# Patient Record
Sex: Female | Born: 1965 | Race: White | Hispanic: No | Marital: Married | Smoking: Never smoker
Health system: Southern US, Community
[De-identification: ages and names within clinical notes are randomized; demographics above are authoritative.]

## PROBLEM LIST (undated history)

## (undated) DIAGNOSIS — C4491 Basal cell carcinoma of skin, unspecified: Secondary | ICD-10-CM

## (undated) HISTORY — DX: Basal cell carcinoma of skin, unspecified: C44.91

## (undated) HISTORY — PX: OTHER SURGICAL HISTORY: SHX169

---

## 2004-03-09 ENCOUNTER — Other Ambulatory Visit: Admission: RE | Admit: 2004-03-09 | Discharge: 2004-03-09 | Payer: Self-pay | Admitting: Obstetrics and Gynecology

## 2005-05-09 ENCOUNTER — Other Ambulatory Visit: Admission: RE | Admit: 2005-05-09 | Discharge: 2005-05-09 | Payer: Self-pay | Admitting: Obstetrics and Gynecology

## 2013-09-23 ENCOUNTER — Other Ambulatory Visit (HOSPITAL_COMMUNITY): Payer: Self-pay | Admitting: Orthopedic Surgery

## 2013-09-23 DIAGNOSIS — M25551 Pain in right hip: Secondary | ICD-10-CM

## 2013-10-01 ENCOUNTER — Other Ambulatory Visit (HOSPITAL_COMMUNITY): Payer: Self-pay

## 2013-10-02 ENCOUNTER — Ambulatory Visit (HOSPITAL_COMMUNITY)
Admission: RE | Admit: 2013-10-02 | Discharge: 2013-10-02 | Disposition: A | Discharge status: Home / Self Care | Payer: 59 | Source: Ambulatory Visit | Attending: Orthopedic Surgery | Admitting: Orthopedic Surgery

## 2013-10-02 DIAGNOSIS — M25551 Pain in right hip: Secondary | ICD-10-CM

## 2013-10-02 DIAGNOSIS — N72 Inflammatory disease of cervix uteri: Secondary | ICD-10-CM | POA: Insufficient documentation

## 2013-10-02 DIAGNOSIS — M25559 Pain in unspecified hip: Secondary | ICD-10-CM | POA: Insufficient documentation

## 2013-10-02 MED ORDER — IOHEXOL 180 MG/ML  SOLN
20.0000 mL | Freq: Once | INTRAMUSCULAR | Status: AC | PRN
  Administered 2013-10-02: 12 mL via INTRA_ARTICULAR

## 2013-10-02 MED ORDER — GADOBENATE DIMEGLUMINE 529 MG/ML IV SOLN
5.0000 mL | Freq: Once | INTRAVENOUS | Status: AC | PRN
  Administered 2013-10-02: 0.1 mL via INTRAVENOUS

## 2013-10-21 ENCOUNTER — Other Ambulatory Visit: Payer: Self-pay | Admitting: Orthopedic Surgery

## 2013-10-21 DIAGNOSIS — M25551 Pain in right hip: Secondary | ICD-10-CM

## 2014-01-23 ENCOUNTER — Other Ambulatory Visit: Payer: Self-pay | Admitting: Obstetrics and Gynecology

## 2014-01-23 DIAGNOSIS — N6324 Unspecified lump in the left breast, lower inner quadrant: Secondary | ICD-10-CM

## 2014-02-05 ENCOUNTER — Ambulatory Visit
Admission: RE | Admit: 2014-02-05 | Discharge: 2014-02-05 | Disposition: A | Discharge status: Home / Self Care | Payer: 59 | Source: Ambulatory Visit | Attending: Obstetrics and Gynecology | Admitting: Obstetrics and Gynecology

## 2014-02-05 ENCOUNTER — Encounter (INDEPENDENT_AMBULATORY_CARE_PROVIDER_SITE_OTHER): Payer: Self-pay

## 2014-02-05 DIAGNOSIS — N6324 Unspecified lump in the left breast, lower inner quadrant: Secondary | ICD-10-CM

## 2015-08-25 ENCOUNTER — Encounter: Payer: Self-pay | Admitting: Sports Medicine

## 2015-08-25 ENCOUNTER — Ambulatory Visit (INDEPENDENT_AMBULATORY_CARE_PROVIDER_SITE_OTHER): Payer: 59 | Admitting: Sports Medicine

## 2015-08-25 VITALS — BP 112/79

## 2015-08-25 DIAGNOSIS — M25551 Pain in right hip: Secondary | ICD-10-CM | POA: Insufficient documentation

## 2015-08-25 DIAGNOSIS — M25552 Pain in left hip: Secondary | ICD-10-CM

## 2015-08-25 MED ORDER — AMITRIPTYLINE HCL 25 MG PO TABS: 25.0000 mg | ORAL_TABLET | Freq: Every day | ORAL | Status: DC

## 2015-08-25 NOTE — Assessment & Plan Note (Signed)
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

## 2015-08-25 NOTE — Progress Notes (Signed)
Subjective:    Patient ID: Joanne Booth, female    DOB: 1966-02-25, 49 y.o.   MRN: [id number]  HPI  49 y/o female with unremarkable PMHx presenting for evaluation of Bilateral Hip / Leg for the last 18 years.  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  Reports first having difficulty/pain 18 years ago following her 2nd SVD.  Pain described as aching sensation in the RIGHT > LEFT ischial tuberosity/hamstring origin.  She reports that this was not (and still is not) present all the time.  Most prominent with excessive hip flexion such as stairs, running.  Now, pain has evolved to also having a dull ache most of the time.  Worse with tight, dull ache.  Sharp pain along ITB and posterior thigh.  She has pain throughout the day and has a heaviness sensation in her legs and a weakness.  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  No longer exercising secondary to pain.  Activity that is most bothersome is hiking/iclines, with equal pain coming up and going down.  Previously seen by Dr. Wynelle Link has been following her for 8 years since moving to [place].  No response to prior intra-articular hip injection; physical therapy at his office.  Reports very poor sleep at night secondary to pain.  PMHx, PSHx, Medications, Allergies and Social history were reviewed and updated in EMR.     Review of Systems 10-point ROS was negative other than above. No hx of joint swelling Some stiffness in mornings Denies hypothyroid sxs No deep pelvic pain    Objective:   Physical Exam  Gen: No acute distress, resting comfortably seated BP 112/79 mmHg  Cardiopulmonary: Non-labored respirations; 2+ pulses in bilateral LE  - Non-tender to palpation along the bilateral SI joint, pubic symphysis, ischial tuberosity and greater trochanter  Bilateral HIP exam: - No abnormalities on inspection - Leg length symmetric and  appropriate when seated and supine - Good pelvis/hip mobility with rocking; Negative FABERE bilaterally Good alignment   - Mild TTP on bilateral SI joints - 4/5 strength in bilateral hip abduction and 5/5 in bilateral adduction --->>> more pain/weakness in stress position for TFL and gluteus medius; 5/5 for gluteus maximus    Trendelenburg bilaterally, LEFT > Right  - 5/5 strength in bilateral prone hip extension (knee extended) and knee flexion (30 degrees flexion) - 5/5 strength in bilateral hip flexion - 5/5 strength in IR and ER bilaterally  - Negative FABERE, NOBLE, OBER's bilaterally  - Negative log roll bilaterally - Negative joint loading/rotation  Imaging: MRI reviewed ---- Most recent 10/02/2013 was reviewed and read as negative MRI-arthogram of the RIGHT hip, negative MRI of LEFT hip. Prior MRI from 2008 not available for review / not located in [place]:   49 y/o female with long-standing history (18 years) of bilateral hip / posterior thigh pain for which she has had extensive workup with orthopedic surgery in the past including recent negative MRI of hips in Late 2014.  Exam today only notable for 4/5 hip abduction strength, especially at TFL and gluteus medius and resultant positive trendelenburg.  Further complicated by poor sleep.  #1. 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  Since that time, caught in biomechanical negative feedback loop with pain, leading to poor gluteus medius firing /  TFL firing.  Extensive workup for structural damage has been unremarkable as performed by referring orthopedic surgeon.  Plan: -- Hip exercises (lateral step ups, cross-over step ups, lateal leg lifts and standing hip rotation) 3 days/week -- Amytriptyline 25mg  qHS until follow-up -- Assess response to pain and sleep quality -- Follow-up in 4-6 weeks -- Consider  EMG or other advanced diagnostics if no improvement  ===

## 2015-09-30 ENCOUNTER — Ambulatory Visit: Payer: 59 | Admitting: Sports Medicine

## 2015-12-09 DIAGNOSIS — E559 Vitamin D deficiency, unspecified: Secondary | ICD-10-CM | POA: Diagnosis not present

## 2015-12-11 DIAGNOSIS — C44511 Basal cell carcinoma of skin of breast: Secondary | ICD-10-CM | POA: Diagnosis not present

## 2015-12-11 DIAGNOSIS — D225 Melanocytic nevi of trunk: Secondary | ICD-10-CM | POA: Diagnosis not present

## 2015-12-11 DIAGNOSIS — D485 Neoplasm of uncertain behavior of skin: Secondary | ICD-10-CM | POA: Diagnosis not present

## 2015-12-11 DIAGNOSIS — C44519 Basal cell carcinoma of skin of other part of trunk: Secondary | ICD-10-CM | POA: Diagnosis not present

## 2015-12-11 DIAGNOSIS — D2272 Melanocytic nevi of left lower limb, including hip: Secondary | ICD-10-CM | POA: Diagnosis not present

## 2015-12-11 DIAGNOSIS — L570 Actinic keratosis: Secondary | ICD-10-CM | POA: Diagnosis not present

## 2015-12-11 DIAGNOSIS — D2271 Melanocytic nevi of right lower limb, including hip: Secondary | ICD-10-CM | POA: Diagnosis not present

## 2015-12-11 DIAGNOSIS — Z85828 Personal history of other malignant neoplasm of skin: Secondary | ICD-10-CM | POA: Diagnosis not present

## 2015-12-30 ENCOUNTER — Ambulatory Visit (INDEPENDENT_AMBULATORY_CARE_PROVIDER_SITE_OTHER): Payer: 59 | Admitting: Sports Medicine

## 2015-12-30 ENCOUNTER — Encounter: Payer: Self-pay | Admitting: Sports Medicine

## 2015-12-30 VITALS — BP 138/70 | HR 64 | Ht 64.0 in | Wt 112.0 lb

## 2015-12-30 DIAGNOSIS — M25551 Pain in right hip: Secondary | ICD-10-CM

## 2015-12-30 DIAGNOSIS — G8929 Other chronic pain: Secondary | ICD-10-CM

## 2015-12-30 MED ORDER — IBUPROFEN 600 MG PO TABS: 600.0000 mg | ORAL_TABLET | Freq: Three times a day (TID) | ORAL | Status: DC | PRN

## 2015-12-30 MED FILL — IBUPROFEN 600 MG TABLET: 600 | 13 days supply | Qty: 40 | Fill #0

## 2015-12-30 NOTE — Progress Notes (Signed)
   Subjective:    Patient ID: Joanne Booth, female    DOB: May 18, 1966, 50 y.o.   MRN: [id number]  HPI  chief complaint: Right hip pain  Patient comes in today complaining of lateral right hip pain. She has a rather extensive history of chronic bilateral hip pain which is well-documented in an office note done by Dr. Oneida Alar on 08/25/2015. Her pain today is primarily along the lateral right hip. She is able to localize it to one specific area. Her pain was quite severe earlier this week but it improved after taking 400 mg of Motrin. She's had a rather extensive workup by orthopedics in the past including MRI scans (see previous office note). The patient was given some hip strengthening exercises in November and states that she has been doing them. She was also prescribed amitriptyline as it was felt there was an element of possible complex regional pain syndrome but she did not notice any relief with this medication. Her pain will occasionally radiate down the lateral leg. No pain past the knee. No groin pain. Pain is worse with exercise and walking. She does get similar pain in the left hip on occasion but she also gets pain in the area of her left proximal hamstring. 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  Past medical history is reviewed Medications are reviewed Allergies are reviewed    Review of Systems    as above Objective:   Physical Exam  Well-developed, fit appearing. No acute distress. Vital signs reviewed  Right hip: There is tenderness to palpation directly over the area of the tensor fascia lata. No tenderness over the greater trochanteric bursa. Smooth painless hip range of motion with a negative log roll. 4+/5 strength with resisted gluteus medius and tensor fascia lata stressing. Neurovascularly intact distally.      Assessment & Plan:   Chronic right hip pain-question chronic tensor fascia lata strain or possibly vastus  lateralis strain  We will schedule an appointment to see Dr Awanda Mink in a couple of weeks for ultrasound evaluation and a possible diagnostic/therapeutic injection. I also briefly discussed eccentric exercises for her chronic left hamstring tendinopathy. Prescription for 600 mg of Motrin with instructions to take 1 pill 3 three times a day as needed for pain.

## 2016-01-13 ENCOUNTER — Ambulatory Visit (INDEPENDENT_AMBULATORY_CARE_PROVIDER_SITE_OTHER): Payer: 59 | Admitting: Family Medicine

## 2016-01-13 ENCOUNTER — Encounter: Payer: Self-pay | Admitting: Family Medicine

## 2016-01-13 VITALS — BP 110/64 | HR 55 | Ht 64.0 in | Wt 112.0 lb

## 2016-01-13 DIAGNOSIS — M6580 Other synovitis and tenosynovitis, unspecified site: Secondary | ICD-10-CM

## 2016-01-13 DIAGNOSIS — M25551 Pain in right hip: Secondary | ICD-10-CM | POA: Diagnosis not present

## 2016-01-13 DIAGNOSIS — M6289 Other specified disorders of muscle: Secondary | ICD-10-CM | POA: Insufficient documentation

## 2016-01-13 MED ORDER — NITROGLYCERIN 0.2 MG/HR TD PT24: MEDICATED_PATCH | TRANSDERMAL | Status: DC

## 2016-01-13 MED ORDER — METHYLPREDNISOLONE ACETATE 40 MG/ML IJ SUSP
40.0000 mg | Freq: Once | INTRAMUSCULAR | Status: AC
  Administered 2016-01-13: 40 mg via INTRA_ARTICULAR

## 2016-01-13 MED FILL — NITROGLYCERIN 0.2 MG/HR PTC: 0.2 | 30 days supply | Qty: 8 | Fill #0

## 2016-01-13 NOTE — Patient Instructions (Signed)

## 2016-01-13 NOTE — Assessment & Plan Note (Signed)
Agree that this is most likely pain secondary to a chronic tensor fascia lata injury. There is a hyperechoic muscle change at the musculotendinous junction as well as right at the gluteus medius. US guided injection today for diagnostic/therapeutic benefit Nitro protocol PT for strengthening/stretching of area F/U in 6 wks   Aspiration/Injection Procedure Note Joanne Booth 17-Mar-1966  Procedure: Injection Indications: R TFL scar tissue/injury   Procedure Details Consent: Risks of procedure as well as the alternatives and risks of each were explained to the (patient/caregiver).  Consent for procedure obtained. Time Out: Verified patient identification, verified procedure, site/side was marked, verified correct patient position, special equipment/implants available, medications/allergies/relevent history reviewed, required imaging and test results available.  Performed.  The area was cleaned with iodine and alcohol swabs.    The R TFL was injected using 1 cc's of 40mg  Depomedrol and 1 cc's of 1% lidocaine with a 21 1 1/2" needle.  Ultrasound was used. Images were obtained in Transverse and Long views showing the injection.    A sterile dressing was applied.  Patient did tolerate procedure well. Estimated blood loss: None

## 2016-01-13 NOTE — Progress Notes (Signed)
  Joanne Booth - 50 y.o. female MRN [id number]  Date of birth: 1965-11-13  SUBJECTIVE:  Including CC & ROS.  Joanne Booth is a 50 y.o. female who presents today for right hip pain.    Hip Pain right lateral, follow-up visit - patient presents today for several years of right lateral hip pain. She states that she has seen many doctors and had many diagnosis. Previously has seen Dr. Reynaldo Minium with an MR arthrogram of the right hip in 2014 that was normal. She has continued to have lateral hip pain especially with hip flexion or deep squatting. She has been seen in this clinic as well and was diagnosed with a complex regional pain versus neuropraxia of the lateral hip. She was previously on amitriptyline with minimal success as well as physical therapy that did not seem to help.  PMHx - Updated and reviewed.  Contributory factors include: Negative PSHx - Updated and reviewed.  Contributory factors include:  Negative FHx - Updated and reviewed.  Contributory factors include:  Negative Social Hx - Updated and reviewed. Contributory factors include: Nonsmoker  Medications - ibuprofen when necessary   12 point ROS negative other than per HPI.   Exam:  Filed Vitals:   01/13/16 1404  BP: 110/64  Pulse: 55    Gen: NAD, AAO 3 Cardio- RRR Pulm - Normal respiratory effort/rate Skin: No rashes or erythema Extremities: No edema  Vascular: pulses +2 bilateral upper and lower extremity Psych: Normal affect  Hip Exam:  Pelvic alignment unremarkable to inspection and palpation. Standing hip rotation and gait without trendelenburg / unsteadiness. Greater trochanter without tenderness to palpation. No tenderness over piriformis and greater trochanter. No SI joint tenderness and normal minimal SI movement. ROM: IR: 80 Deg, ER: 80 Deg, Flexion: 120 Deg, Extension: 100 Deg, Abduction: 45 Deg, Adduction: 45 Deg Strength:  IR: 5/5, ER: 5/5, Flexion (0 and 90 degrees): 5/5, Extension: 5/5,  Abduction: 5/5, Adduction: 5/5 Negative Thomas test  Negative FADIR.  Negative FADIR with axial compression Negative FAIR and [place]  Negative FABER in all directions, negative posterior shear, negative Gaenslen  Negative Hop Test and Fulcrum  Negative Noble and Ober testing   Neurovascularly intact B/L LE  Imaging: Ultrasound of the right hip reveals hypoechoic change consistent with scar tissue and the tensor fascia lata. The gluteus medius and minimus as well as the rectus femoris insertion and muscle belly appeared normal.

## 2016-02-17 DIAGNOSIS — H2513 Age-related nuclear cataract, bilateral: Secondary | ICD-10-CM | POA: Diagnosis not present

## 2016-02-17 DIAGNOSIS — D3131 Benign neoplasm of right choroid: Secondary | ICD-10-CM | POA: Diagnosis not present

## 2016-02-17 DIAGNOSIS — L718 Other rosacea: Secondary | ICD-10-CM | POA: Diagnosis not present

## 2016-02-17 DIAGNOSIS — H04123 Dry eye syndrome of bilateral lacrimal glands: Secondary | ICD-10-CM | POA: Diagnosis not present

## 2016-03-02 ENCOUNTER — Ambulatory Visit: Payer: 59 | Admitting: Family Medicine

## 2016-03-09 ENCOUNTER — Ambulatory Visit (INDEPENDENT_AMBULATORY_CARE_PROVIDER_SITE_OTHER): Payer: 59 | Admitting: Family Medicine

## 2016-03-09 ENCOUNTER — Encounter: Payer: Self-pay | Admitting: Family Medicine

## 2016-03-09 VITALS — BP 126/76 | Ht 64.0 in | Wt 112.0 lb

## 2016-03-09 DIAGNOSIS — M6580 Other synovitis and tenosynovitis, unspecified site: Secondary | ICD-10-CM | POA: Diagnosis not present

## 2016-03-09 DIAGNOSIS — M25551 Pain in right hip: Secondary | ICD-10-CM

## 2016-03-09 DIAGNOSIS — M6289 Other specified disorders of muscle: Secondary | ICD-10-CM

## 2016-03-09 NOTE — Assessment & Plan Note (Signed)
Patient with previous evidence of TFL tendinosis on ultrasound. We went ahead and injected this before. She received about 1-2 weeks of some relief but continues to have substantial pain there. At this point I think with her underlying symptoms we need to rule out both back and intra-articular/extra-articular hip pathology. She would like to start with an MRI of her right hip region. If this is completely negative I would consider exploring her low back. -Patient is amenable to this will follow-up after the MRI of the right hip.

## 2016-03-09 NOTE — Progress Notes (Signed)
  Joanne Booth - 50 y.o. female MRN [id number]  Date of birth: 06-25-1966  SUBJECTIVE:  Including CC & ROS.  Joanne Booth is a 50 y.o. female who presents today for right hip pain.    Hip Pain right lateral, follow-up visit - patient presents today for several years of right lateral hip pain. She states that she has seen many doctors and had many diagnosis. Previously has seen Dr. Reynaldo Minium with an MR arthrogram of the right hip in 2014 that was normal. She has continued to have lateral hip pain especially with hip flexion or deep squatting. She has been seen in this clinic as well and was diagnosed with a complex regional pain versus neuropraxia of the lateral hip. She was previously on amitriptyline with minimal success as well as physical therapy that did not seem to help.  F/U R hip pain 03/09/16 - patient received about 10-14 days of some relief secondary to the injection but continues to have substantial pain localized to the right lateral hip region. Sometimes goes down the outside aspect of her leg to her knee but she denies any further exacerbations or paresthesias into her foot. Has continued to try ibuprofen with minimal relief as well as physical therapy and stretches.  PMHx - Updated and reviewed.  Contributory factors include: Negative PSHx - Updated and reviewed.  Contributory factors include:  Negative FHx - Updated and reviewed.  Contributory factors include:  Negative Social Hx - Updated and reviewed. Contributory factors include: Nonsmoker  Medications - ibuprofen when necessary   12 point ROS negative other than per HPI.   Exam:  Filed Vitals:   03/09/16 1456  BP: 126/76    Gen: NAD, AAO 3 Cardio- RRR Pulm - Normal respiratory effort/rate Skin: No rashes or erythema Extremities: No edema  Vascular: pulses +2 bilateral upper and lower extremity Psych: Normal affect  R Hip Exam:  Pelvic alignment unremarkable to inspection and palpation. Standing hip rotation  and gait without trendelenburg / unsteadiness. Greater trochanter without tenderness to palpation. No tenderness over piriformis and greater trochanter. No SI joint tenderness and normal minimal SI movement. ROM: IR: 80 Deg, ER: 80 Deg, Flexion: 120 Deg, Extension: 100 Deg, Abduction: 45 Deg, Adduction: 45 Deg Strength:  IR: 5/5, ER: 5/5, Flexion (0 and 90 degrees): 5/5, Extension: 5/5, Abduction: 5/5, Adduction: 5/5 Negative Thomas test  Negative FADIR.  Negative FADIR with axial compression Negative FABER in all directions, negative posterior shear, negative Gaenslen   Neurovascularly intact B/L LE  Previous Imaging: Ultrasound of the right hip reveals hypoechoic change consistent with scar tissue and the tensor fascia lata. The gluteus medius and minimus as well as the rectus femoris insertion and muscle belly appeared normal.

## 2016-03-31 DIAGNOSIS — D125 Benign neoplasm of sigmoid colon: Secondary | ICD-10-CM | POA: Diagnosis not present

## 2016-03-31 DIAGNOSIS — K635 Polyp of colon: Secondary | ICD-10-CM | POA: Diagnosis not present

## 2016-03-31 DIAGNOSIS — Z1211 Encounter for screening for malignant neoplasm of colon: Secondary | ICD-10-CM | POA: Diagnosis not present

## 2016-06-16 DIAGNOSIS — L570 Actinic keratosis: Secondary | ICD-10-CM | POA: Diagnosis not present

## 2016-06-16 DIAGNOSIS — D225 Melanocytic nevi of trunk: Secondary | ICD-10-CM | POA: Diagnosis not present

## 2016-06-16 DIAGNOSIS — D1801 Hemangioma of skin and subcutaneous tissue: Secondary | ICD-10-CM | POA: Diagnosis not present

## 2016-06-16 DIAGNOSIS — D485 Neoplasm of uncertain behavior of skin: Secondary | ICD-10-CM | POA: Diagnosis not present

## 2016-06-16 DIAGNOSIS — Z85828 Personal history of other malignant neoplasm of skin: Secondary | ICD-10-CM | POA: Diagnosis not present

## 2016-06-16 DIAGNOSIS — L821 Other seborrheic keratosis: Secondary | ICD-10-CM | POA: Diagnosis not present

## 2016-06-16 DIAGNOSIS — C44519 Basal cell carcinoma of skin of other part of trunk: Secondary | ICD-10-CM | POA: Diagnosis not present

## 2016-12-07 DIAGNOSIS — Z681 Body mass index (BMI) 19 or less, adult: Secondary | ICD-10-CM | POA: Diagnosis not present

## 2016-12-07 DIAGNOSIS — Z1231 Encounter for screening mammogram for malignant neoplasm of breast: Secondary | ICD-10-CM | POA: Diagnosis not present

## 2016-12-07 DIAGNOSIS — Z01419 Encounter for gynecological examination (general) (routine) without abnormal findings: Secondary | ICD-10-CM | POA: Diagnosis not present

## 2016-12-08 ENCOUNTER — Other Ambulatory Visit: Payer: Self-pay | Admitting: Obstetrics and Gynecology

## 2016-12-08 DIAGNOSIS — R928 Other abnormal and inconclusive findings on diagnostic imaging of breast: Secondary | ICD-10-CM

## 2016-12-09 ENCOUNTER — Other Ambulatory Visit: Payer: Self-pay | Admitting: Obstetrics and Gynecology

## 2016-12-09 ENCOUNTER — Other Ambulatory Visit: Payer: Self-pay

## 2016-12-09 ENCOUNTER — Ambulatory Visit
Admission: RE | Admit: 2016-12-09 | Discharge: 2016-12-09 | Disposition: A | Discharge status: Home / Self Care | Payer: 59 | Source: Ambulatory Visit | Attending: Obstetrics and Gynecology | Admitting: Obstetrics and Gynecology

## 2016-12-09 DIAGNOSIS — R928 Other abnormal and inconclusive findings on diagnostic imaging of breast: Secondary | ICD-10-CM

## 2016-12-09 DIAGNOSIS — R922 Inconclusive mammogram: Secondary | ICD-10-CM | POA: Diagnosis not present

## 2016-12-09 DIAGNOSIS — N6489 Other specified disorders of breast: Secondary | ICD-10-CM | POA: Diagnosis not present

## 2016-12-14 ENCOUNTER — Other Ambulatory Visit: Payer: 59

## 2016-12-14 DIAGNOSIS — Z13820 Encounter for screening for osteoporosis: Secondary | ICD-10-CM | POA: Diagnosis not present

## 2017-01-24 DIAGNOSIS — D225 Melanocytic nevi of trunk: Secondary | ICD-10-CM | POA: Diagnosis not present

## 2017-01-24 DIAGNOSIS — D2272 Melanocytic nevi of left lower limb, including hip: Secondary | ICD-10-CM | POA: Diagnosis not present

## 2017-01-24 DIAGNOSIS — D2262 Melanocytic nevi of left upper limb, including shoulder: Secondary | ICD-10-CM | POA: Diagnosis not present

## 2017-01-24 DIAGNOSIS — D2261 Melanocytic nevi of right upper limb, including shoulder: Secondary | ICD-10-CM | POA: Diagnosis not present

## 2017-01-24 DIAGNOSIS — D1801 Hemangioma of skin and subcutaneous tissue: Secondary | ICD-10-CM | POA: Diagnosis not present

## 2017-01-24 DIAGNOSIS — L821 Other seborrheic keratosis: Secondary | ICD-10-CM | POA: Diagnosis not present

## 2017-01-24 DIAGNOSIS — L814 Other melanin hyperpigmentation: Secondary | ICD-10-CM | POA: Diagnosis not present

## 2017-01-24 DIAGNOSIS — D2271 Melanocytic nevi of right lower limb, including hip: Secondary | ICD-10-CM | POA: Diagnosis not present

## 2017-01-24 DIAGNOSIS — Z85828 Personal history of other malignant neoplasm of skin: Secondary | ICD-10-CM | POA: Diagnosis not present

## 2017-05-10 DIAGNOSIS — H2513 Age-related nuclear cataract, bilateral: Secondary | ICD-10-CM | POA: Diagnosis not present

## 2017-05-10 DIAGNOSIS — H04123 Dry eye syndrome of bilateral lacrimal glands: Secondary | ICD-10-CM | POA: Diagnosis not present

## 2017-05-10 DIAGNOSIS — H524 Presbyopia: Secondary | ICD-10-CM | POA: Diagnosis not present

## 2017-05-10 DIAGNOSIS — H25013 Cortical age-related cataract, bilateral: Secondary | ICD-10-CM | POA: Diagnosis not present

## 2017-07-05 ENCOUNTER — Other Ambulatory Visit: Payer: Self-pay | Admitting: Obstetrics and Gynecology

## 2017-07-05 DIAGNOSIS — N6489 Other specified disorders of breast: Secondary | ICD-10-CM

## 2017-07-12 ENCOUNTER — Ambulatory Visit: Payer: 59

## 2017-07-12 ENCOUNTER — Ambulatory Visit
Admission: RE | Admit: 2017-07-12 | Discharge: 2017-07-12 | Disposition: A | Discharge status: Home / Self Care | Payer: 59 | Source: Ambulatory Visit | Attending: Obstetrics and Gynecology | Admitting: Obstetrics and Gynecology

## 2017-07-12 DIAGNOSIS — N6489 Other specified disorders of breast: Secondary | ICD-10-CM

## 2017-07-12 DIAGNOSIS — R922 Inconclusive mammogram: Secondary | ICD-10-CM | POA: Diagnosis not present

## 2018-03-28 DIAGNOSIS — Z1231 Encounter for screening mammogram for malignant neoplasm of breast: Secondary | ICD-10-CM | POA: Diagnosis not present

## 2018-03-28 DIAGNOSIS — Z01419 Encounter for gynecological examination (general) (routine) without abnormal findings: Secondary | ICD-10-CM | POA: Diagnosis not present

## 2018-03-28 DIAGNOSIS — Z681 Body mass index (BMI) 19 or less, adult: Secondary | ICD-10-CM | POA: Diagnosis not present

## 2018-06-09 IMAGING — MG 2D DIGITAL DIAGNOSTIC UNILATERAL RIGHT MAMMOGRAM WITH CAD AND AD
6 series · 6 of 14 positions shown · non-contrast
Comparison: Previous exam(s).

CLINICAL DATA: 51-year-old female presenting for follow-up of a
probably benign right breast asymmetry.

EXAM:
2D DIGITAL DIAGNOSTIC UNILATERAL RIGHT MAMMOGRAM WITH CAD AND
ADJUNCT TOMO

[R CC]
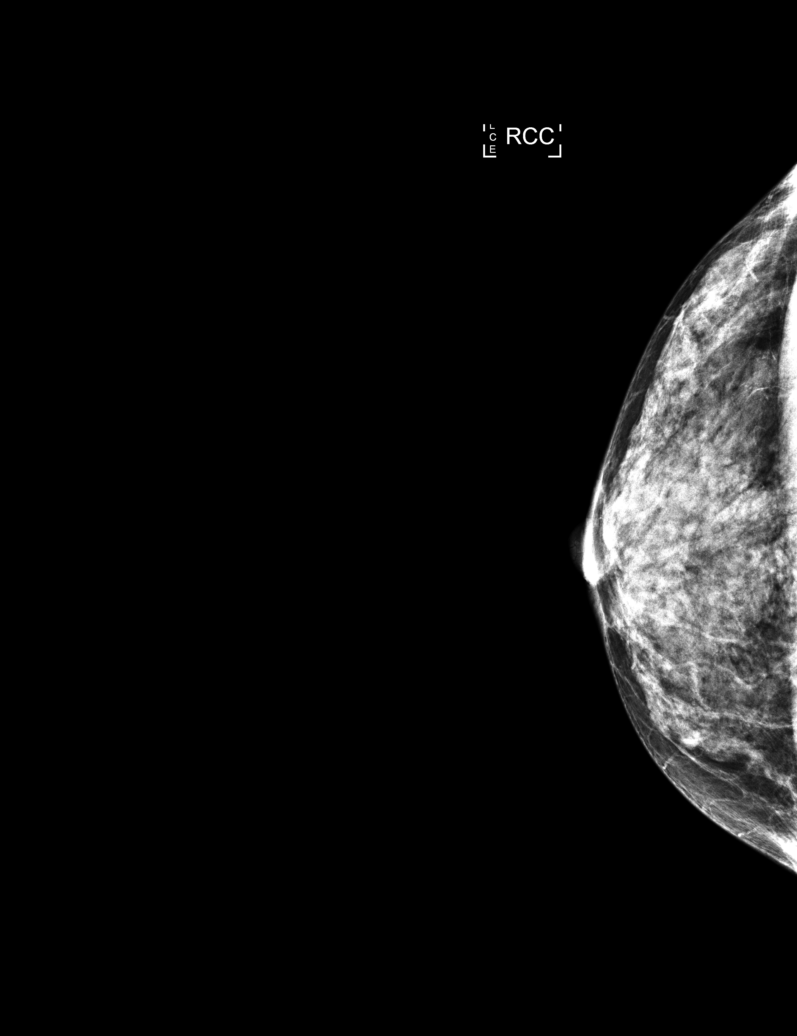

[R MLO synth-2D]
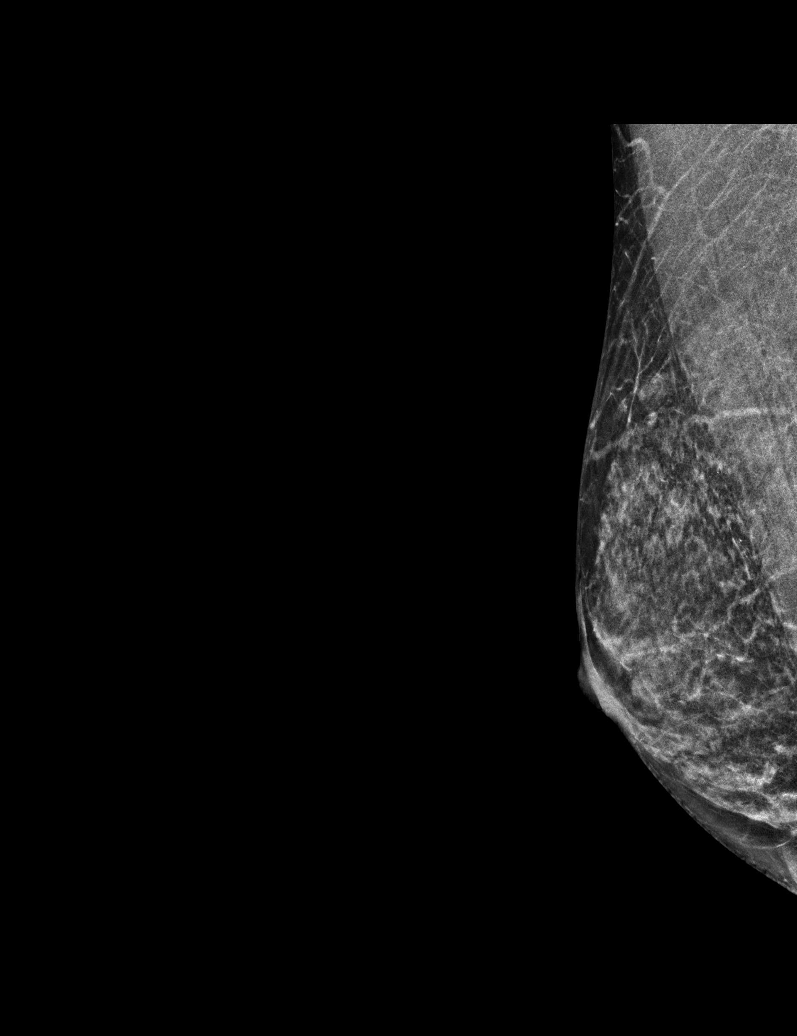

[R CC synth-2D]
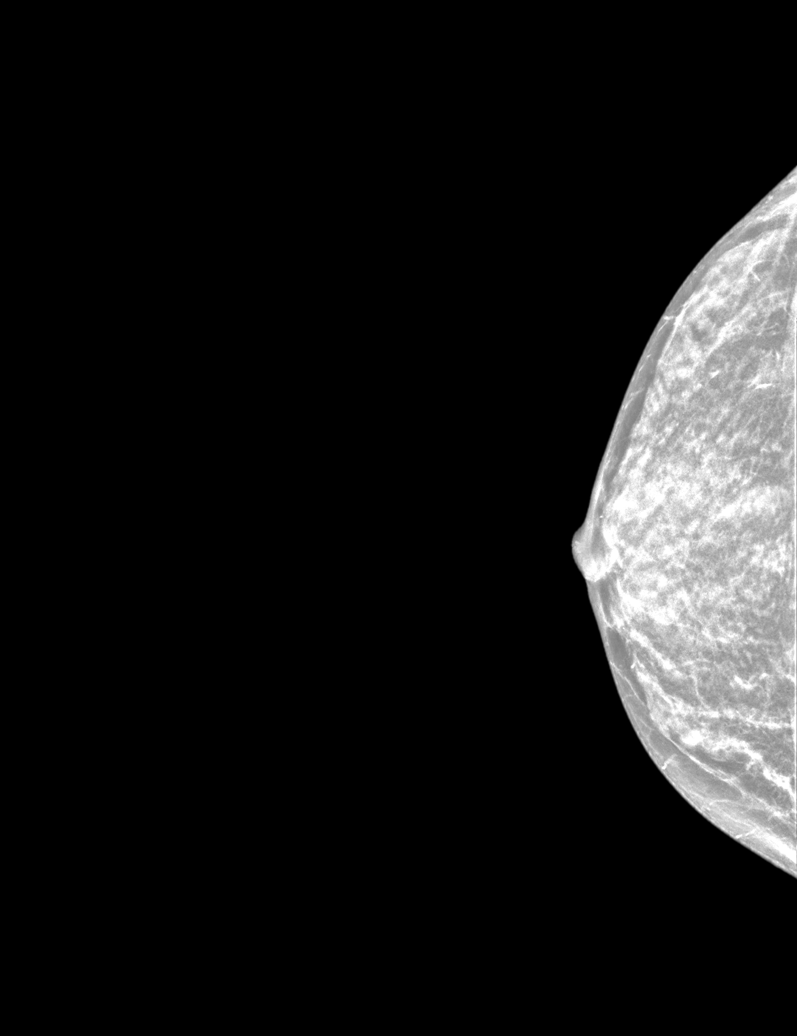

[R MLO]
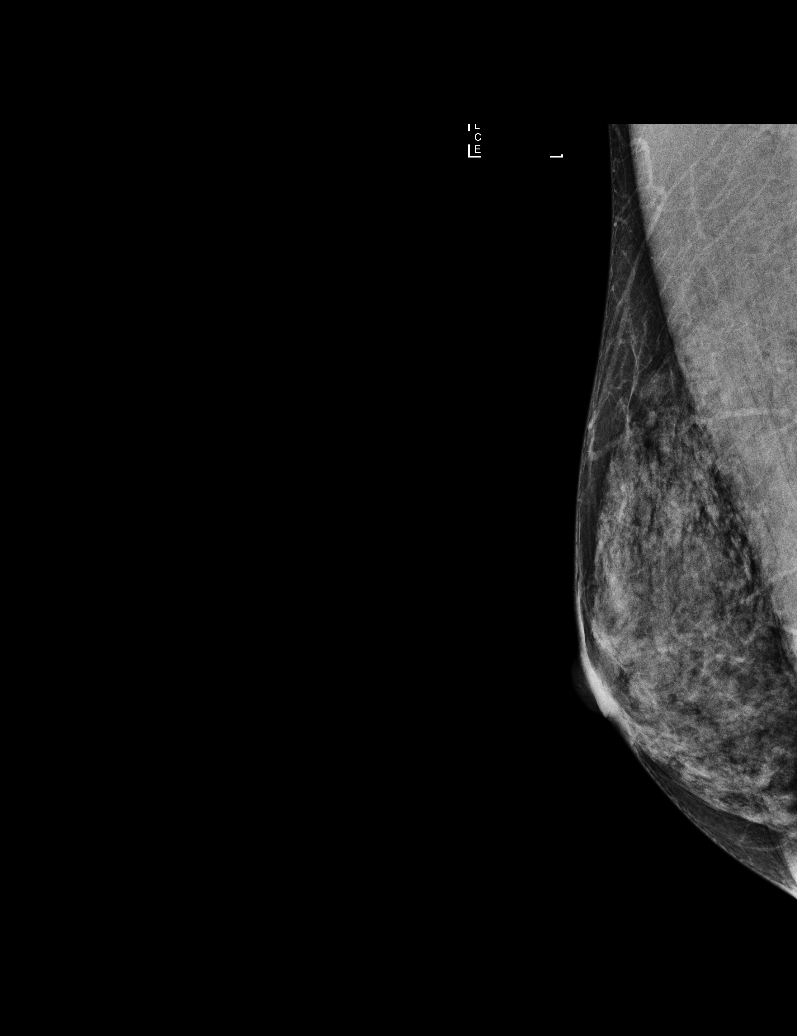

[R MLO tomo · tomo slice 19/37.0]
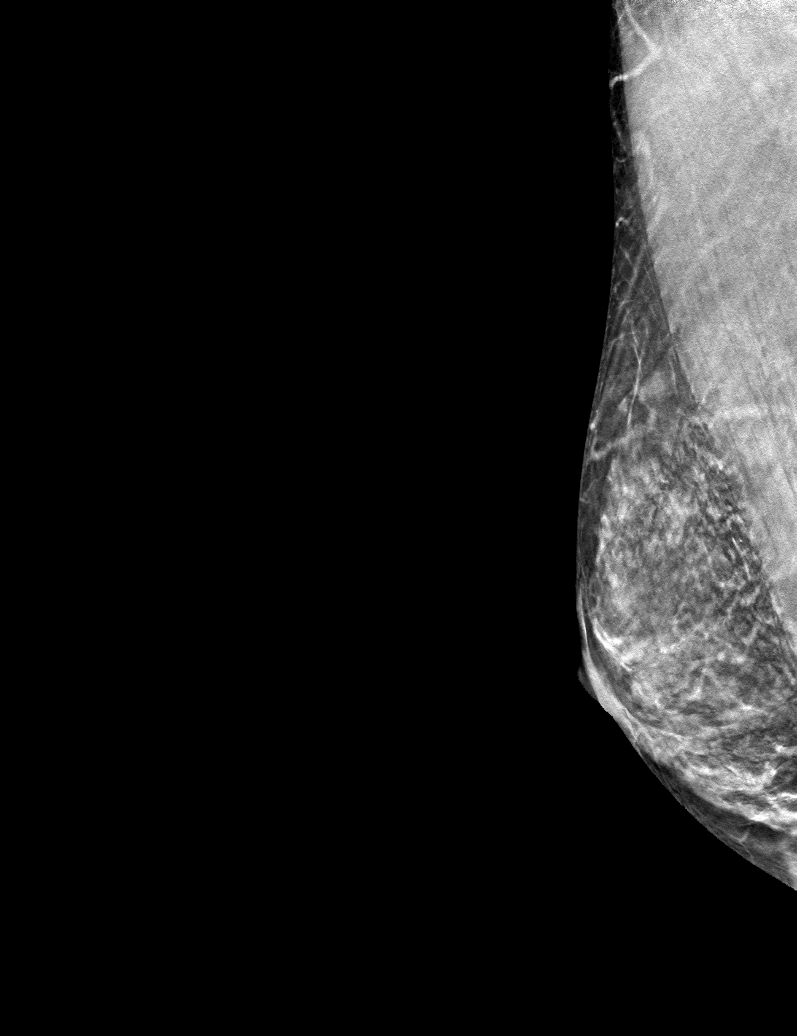

[R CC tomo · tomo slice 17/34.0]
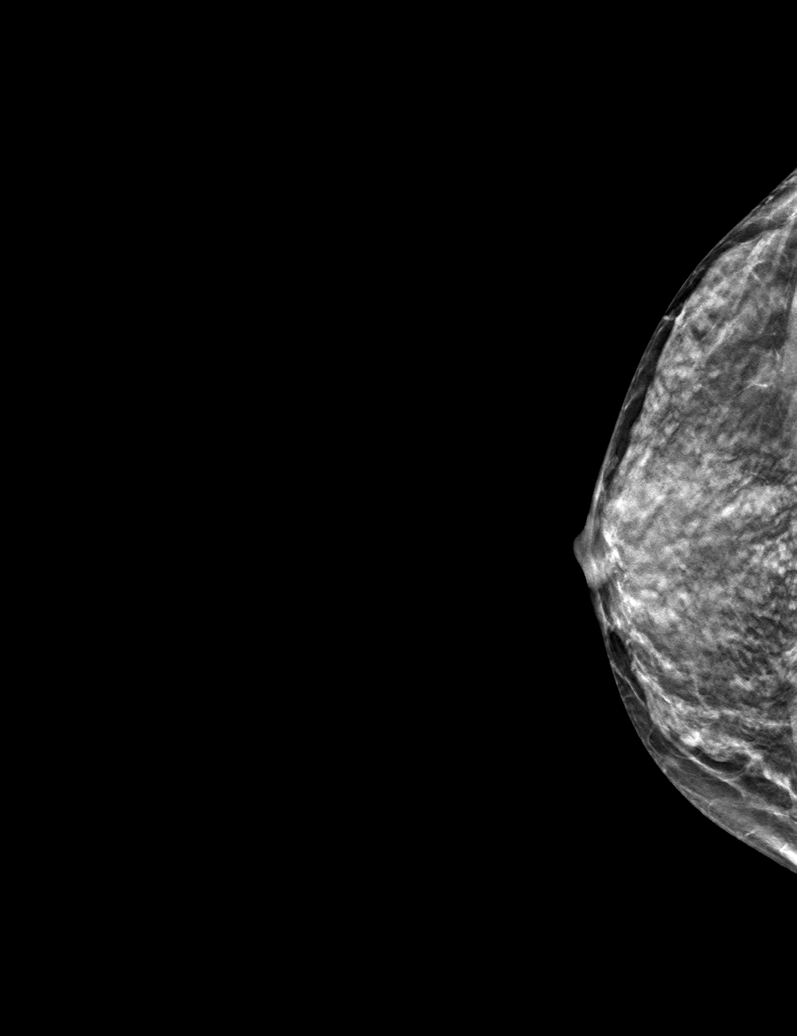

[6 of 14 positions shown; findings below may reference images not displayed]

ACR Breast Density Category d: The breast tissue is extremely dense,
which lowers the sensitivity of mammography.
FINDINGS: The asymmetry of concern in the central posterior right breast
previously seen on the CC view only is compatible with normal
fibroglandular tissue and is stable as compared with multiple prior
mammograms dating back to 9331. No suspicious calcifications, masses
or areas of distortion are seen in the right breast.

Mammographic images were processed with CAD.
IMPRESSION: There is no persistent abnormality in the right breast. No
mammographic evidence of malignancy.

RECOMMENDATION:
Return to routine screening mammography is recommended. The patient
will be due for screening in Friday November, 2017.

I have discussed the findings and recommendations with the patient.
Results were also provided in writing at the conclusion of the
visit. If applicable, a reminder letter will be sent to the patient
regarding the next appointment.

BI-RADS CATEGORY  1: Negative.

## 2018-08-06 DIAGNOSIS — M9905 Segmental and somatic dysfunction of pelvic region: Secondary | ICD-10-CM | POA: Diagnosis not present

## 2018-08-06 DIAGNOSIS — M62830 Muscle spasm of back: Secondary | ICD-10-CM | POA: Diagnosis not present

## 2018-08-06 DIAGNOSIS — M9903 Segmental and somatic dysfunction of lumbar region: Secondary | ICD-10-CM | POA: Diagnosis not present

## 2018-08-06 DIAGNOSIS — M9902 Segmental and somatic dysfunction of thoracic region: Secondary | ICD-10-CM | POA: Diagnosis not present

## 2018-08-09 DIAGNOSIS — M9903 Segmental and somatic dysfunction of lumbar region: Secondary | ICD-10-CM | POA: Diagnosis not present

## 2018-08-09 DIAGNOSIS — M9902 Segmental and somatic dysfunction of thoracic region: Secondary | ICD-10-CM | POA: Diagnosis not present

## 2018-08-09 DIAGNOSIS — M9905 Segmental and somatic dysfunction of pelvic region: Secondary | ICD-10-CM | POA: Diagnosis not present

## 2018-08-09 DIAGNOSIS — M62830 Muscle spasm of back: Secondary | ICD-10-CM | POA: Diagnosis not present

## 2018-08-13 DIAGNOSIS — M9905 Segmental and somatic dysfunction of pelvic region: Secondary | ICD-10-CM | POA: Diagnosis not present

## 2018-08-13 DIAGNOSIS — M9903 Segmental and somatic dysfunction of lumbar region: Secondary | ICD-10-CM | POA: Diagnosis not present

## 2018-08-13 DIAGNOSIS — M9902 Segmental and somatic dysfunction of thoracic region: Secondary | ICD-10-CM | POA: Diagnosis not present

## 2018-08-13 DIAGNOSIS — M62830 Muscle spasm of back: Secondary | ICD-10-CM | POA: Diagnosis not present

## 2018-08-16 DIAGNOSIS — M9902 Segmental and somatic dysfunction of thoracic region: Secondary | ICD-10-CM | POA: Diagnosis not present

## 2018-08-16 DIAGNOSIS — M9905 Segmental and somatic dysfunction of pelvic region: Secondary | ICD-10-CM | POA: Diagnosis not present

## 2018-08-16 DIAGNOSIS — M9903 Segmental and somatic dysfunction of lumbar region: Secondary | ICD-10-CM | POA: Diagnosis not present

## 2018-08-16 DIAGNOSIS — M62830 Muscle spasm of back: Secondary | ICD-10-CM | POA: Diagnosis not present

## 2018-08-27 DIAGNOSIS — M9902 Segmental and somatic dysfunction of thoracic region: Secondary | ICD-10-CM | POA: Diagnosis not present

## 2018-08-27 DIAGNOSIS — M62830 Muscle spasm of back: Secondary | ICD-10-CM | POA: Diagnosis not present

## 2018-08-27 DIAGNOSIS — M9903 Segmental and somatic dysfunction of lumbar region: Secondary | ICD-10-CM | POA: Diagnosis not present

## 2018-08-27 DIAGNOSIS — M9905 Segmental and somatic dysfunction of pelvic region: Secondary | ICD-10-CM | POA: Diagnosis not present

## 2018-09-03 DIAGNOSIS — M9902 Segmental and somatic dysfunction of thoracic region: Secondary | ICD-10-CM | POA: Diagnosis not present

## 2018-09-03 DIAGNOSIS — M62830 Muscle spasm of back: Secondary | ICD-10-CM | POA: Diagnosis not present

## 2018-09-03 DIAGNOSIS — M9905 Segmental and somatic dysfunction of pelvic region: Secondary | ICD-10-CM | POA: Diagnosis not present

## 2018-09-03 DIAGNOSIS — M9903 Segmental and somatic dysfunction of lumbar region: Secondary | ICD-10-CM | POA: Diagnosis not present

## 2018-09-10 DIAGNOSIS — H524 Presbyopia: Secondary | ICD-10-CM | POA: Diagnosis not present

## 2018-09-10 DIAGNOSIS — H04123 Dry eye syndrome of bilateral lacrimal glands: Secondary | ICD-10-CM | POA: Diagnosis not present

## 2018-09-10 DIAGNOSIS — H2513 Age-related nuclear cataract, bilateral: Secondary | ICD-10-CM | POA: Diagnosis not present

## 2018-09-10 DIAGNOSIS — L718 Other rosacea: Secondary | ICD-10-CM | POA: Diagnosis not present

## 2018-09-10 DIAGNOSIS — H25013 Cortical age-related cataract, bilateral: Secondary | ICD-10-CM | POA: Diagnosis not present

## 2018-09-12 DIAGNOSIS — M62830 Muscle spasm of back: Secondary | ICD-10-CM | POA: Diagnosis not present

## 2018-09-12 DIAGNOSIS — M9905 Segmental and somatic dysfunction of pelvic region: Secondary | ICD-10-CM | POA: Diagnosis not present

## 2018-09-12 DIAGNOSIS — M9902 Segmental and somatic dysfunction of thoracic region: Secondary | ICD-10-CM | POA: Diagnosis not present

## 2018-09-12 DIAGNOSIS — M9903 Segmental and somatic dysfunction of lumbar region: Secondary | ICD-10-CM | POA: Diagnosis not present

## 2019-02-21 DIAGNOSIS — C44519 Basal cell carcinoma of skin of other part of trunk: Secondary | ICD-10-CM | POA: Diagnosis not present

## 2019-02-21 DIAGNOSIS — D485 Neoplasm of uncertain behavior of skin: Secondary | ICD-10-CM | POA: Diagnosis not present

## 2019-02-21 DIAGNOSIS — Z85828 Personal history of other malignant neoplasm of skin: Secondary | ICD-10-CM | POA: Diagnosis not present

## 2019-06-12 DIAGNOSIS — Z01419 Encounter for gynecological examination (general) (routine) without abnormal findings: Secondary | ICD-10-CM | POA: Diagnosis not present

## 2019-06-12 DIAGNOSIS — Z1231 Encounter for screening mammogram for malignant neoplasm of breast: Secondary | ICD-10-CM | POA: Diagnosis not present

## 2019-06-12 DIAGNOSIS — Z681 Body mass index (BMI) 19 or less, adult: Secondary | ICD-10-CM | POA: Diagnosis not present

## 2019-07-10 ENCOUNTER — Other Ambulatory Visit: Payer: Self-pay

## 2019-07-10 DIAGNOSIS — Z20822 Contact with and (suspected) exposure to covid-19: Secondary | ICD-10-CM

## 2019-07-11 LAB — NOVEL CORONAVIRUS, NAA: SARS-CoV-2, NAA: NOT DETECTED

## 2019-08-08 ENCOUNTER — Other Ambulatory Visit: Payer: Self-pay

## 2019-08-08 DIAGNOSIS — Z20822 Contact with and (suspected) exposure to covid-19: Secondary | ICD-10-CM

## 2019-08-10 LAB — NOVEL CORONAVIRUS, NAA: SARS-CoV-2, NAA: NOT DETECTED

## 2019-09-08 ENCOUNTER — Other Ambulatory Visit: Payer: Self-pay | Admitting: Cardiology

## 2019-09-08 DIAGNOSIS — Z20822 Contact with and (suspected) exposure to covid-19: Secondary | ICD-10-CM

## 2019-09-09 LAB — NOVEL CORONAVIRUS, NAA: SARS-CoV-2, NAA: NOT DETECTED

## 2019-09-16 DIAGNOSIS — H04123 Dry eye syndrome of bilateral lacrimal glands: Secondary | ICD-10-CM | POA: Diagnosis not present

## 2019-09-16 DIAGNOSIS — L719 Rosacea, unspecified: Secondary | ICD-10-CM | POA: Diagnosis not present

## 2019-09-16 DIAGNOSIS — H2513 Age-related nuclear cataract, bilateral: Secondary | ICD-10-CM | POA: Diagnosis not present

## 2019-09-16 DIAGNOSIS — H25013 Cortical age-related cataract, bilateral: Secondary | ICD-10-CM | POA: Diagnosis not present

## 2019-10-07 DIAGNOSIS — J069 Acute upper respiratory infection, unspecified: Secondary | ICD-10-CM | POA: Diagnosis not present

## 2019-10-07 DIAGNOSIS — Z1159 Encounter for screening for other viral diseases: Secondary | ICD-10-CM | POA: Diagnosis not present

## 2020-01-21 DIAGNOSIS — H00022 Hordeolum internum right lower eyelid: Secondary | ICD-10-CM | POA: Diagnosis not present

## 2020-01-21 DIAGNOSIS — H00032 Abscess of right lower eyelid: Secondary | ICD-10-CM | POA: Diagnosis not present

## 2020-01-21 MED FILL — CEPHALEXIN 500 MG CAPSULE: 500 | 7 days supply | Qty: 14 | Fill #0

## 2020-01-21 MED FILL — NEO/POLY/DEXAMET EYE OINT: 3.5-10000-0 | 7 days supply | Qty: 4 | Fill #0

## 2020-05-21 DIAGNOSIS — D2272 Melanocytic nevi of left lower limb, including hip: Secondary | ICD-10-CM | POA: Diagnosis not present

## 2020-05-21 DIAGNOSIS — Z85828 Personal history of other malignant neoplasm of skin: Secondary | ICD-10-CM | POA: Diagnosis not present

## 2020-05-21 DIAGNOSIS — D2261 Melanocytic nevi of right upper limb, including shoulder: Secondary | ICD-10-CM | POA: Diagnosis not present

## 2020-05-21 DIAGNOSIS — L245 Irritant contact dermatitis due to other chemical products: Secondary | ICD-10-CM | POA: Diagnosis not present

## 2020-05-21 DIAGNOSIS — D225 Melanocytic nevi of trunk: Secondary | ICD-10-CM | POA: Diagnosis not present

## 2020-05-21 DIAGNOSIS — D2262 Melanocytic nevi of left upper limb, including shoulder: Secondary | ICD-10-CM | POA: Diagnosis not present

## 2020-05-21 DIAGNOSIS — L814 Other melanin hyperpigmentation: Secondary | ICD-10-CM | POA: Diagnosis not present

## 2020-05-21 DIAGNOSIS — D1801 Hemangioma of skin and subcutaneous tissue: Secondary | ICD-10-CM | POA: Diagnosis not present

## 2020-05-21 DIAGNOSIS — L821 Other seborrheic keratosis: Secondary | ICD-10-CM | POA: Diagnosis not present

## 2020-06-25 DIAGNOSIS — M62830 Muscle spasm of back: Secondary | ICD-10-CM | POA: Diagnosis not present

## 2020-06-25 DIAGNOSIS — M9902 Segmental and somatic dysfunction of thoracic region: Secondary | ICD-10-CM | POA: Diagnosis not present

## 2020-06-25 DIAGNOSIS — M9903 Segmental and somatic dysfunction of lumbar region: Secondary | ICD-10-CM | POA: Diagnosis not present

## 2020-06-25 DIAGNOSIS — M9905 Segmental and somatic dysfunction of pelvic region: Secondary | ICD-10-CM | POA: Diagnosis not present

## 2020-07-01 DIAGNOSIS — M9903 Segmental and somatic dysfunction of lumbar region: Secondary | ICD-10-CM | POA: Diagnosis not present

## 2020-07-01 DIAGNOSIS — M62830 Muscle spasm of back: Secondary | ICD-10-CM | POA: Diagnosis not present

## 2020-07-01 DIAGNOSIS — M9902 Segmental and somatic dysfunction of thoracic region: Secondary | ICD-10-CM | POA: Diagnosis not present

## 2020-07-01 DIAGNOSIS — M9905 Segmental and somatic dysfunction of pelvic region: Secondary | ICD-10-CM | POA: Diagnosis not present

## 2020-07-08 DIAGNOSIS — M9903 Segmental and somatic dysfunction of lumbar region: Secondary | ICD-10-CM | POA: Diagnosis not present

## 2020-07-08 DIAGNOSIS — M62830 Muscle spasm of back: Secondary | ICD-10-CM | POA: Diagnosis not present

## 2020-07-08 DIAGNOSIS — M9902 Segmental and somatic dysfunction of thoracic region: Secondary | ICD-10-CM | POA: Diagnosis not present

## 2020-07-08 DIAGNOSIS — M9905 Segmental and somatic dysfunction of pelvic region: Secondary | ICD-10-CM | POA: Diagnosis not present

## 2020-09-15 DIAGNOSIS — M9903 Segmental and somatic dysfunction of lumbar region: Secondary | ICD-10-CM | POA: Diagnosis not present

## 2020-09-15 DIAGNOSIS — M9905 Segmental and somatic dysfunction of pelvic region: Secondary | ICD-10-CM | POA: Diagnosis not present

## 2020-09-15 DIAGNOSIS — M62830 Muscle spasm of back: Secondary | ICD-10-CM | POA: Diagnosis not present

## 2020-09-15 DIAGNOSIS — M9902 Segmental and somatic dysfunction of thoracic region: Secondary | ICD-10-CM | POA: Diagnosis not present

## 2020-09-17 DIAGNOSIS — M9905 Segmental and somatic dysfunction of pelvic region: Secondary | ICD-10-CM | POA: Diagnosis not present

## 2020-09-17 DIAGNOSIS — M62830 Muscle spasm of back: Secondary | ICD-10-CM | POA: Diagnosis not present

## 2020-09-17 DIAGNOSIS — M9903 Segmental and somatic dysfunction of lumbar region: Secondary | ICD-10-CM | POA: Diagnosis not present

## 2020-09-17 DIAGNOSIS — M9902 Segmental and somatic dysfunction of thoracic region: Secondary | ICD-10-CM | POA: Diagnosis not present

## 2020-09-21 ENCOUNTER — Other Ambulatory Visit (HOSPITAL_COMMUNITY): Payer: Self-pay | Admitting: Occupational Medicine

## 2020-09-21 MED FILL — METHYLPREDNISOLONE 4 MG TBP: 4 | 6 days supply | Qty: 21 | Fill #0

## 2020-09-22 DIAGNOSIS — M62830 Muscle spasm of back: Secondary | ICD-10-CM | POA: Diagnosis not present

## 2020-09-22 DIAGNOSIS — M9903 Segmental and somatic dysfunction of lumbar region: Secondary | ICD-10-CM | POA: Diagnosis not present

## 2020-09-22 DIAGNOSIS — M9905 Segmental and somatic dysfunction of pelvic region: Secondary | ICD-10-CM | POA: Diagnosis not present

## 2020-09-22 DIAGNOSIS — M9902 Segmental and somatic dysfunction of thoracic region: Secondary | ICD-10-CM | POA: Diagnosis not present

## 2020-09-29 ENCOUNTER — Ambulatory Visit: Payer: PRIVATE HEALTH INSURANCE | Attending: Occupational Medicine

## 2020-09-29 ENCOUNTER — Other Ambulatory Visit: Payer: Self-pay

## 2020-09-29 DIAGNOSIS — M545 Low back pain, unspecified: Secondary | ICD-10-CM | POA: Diagnosis not present

## 2020-09-29 DIAGNOSIS — M62830 Muscle spasm of back: Secondary | ICD-10-CM | POA: Diagnosis present

## 2020-09-29 NOTE — Therapy (Signed)
[place], [place], [address] Phone: [phone]   Fax:  [phone]  Physical Therapy Evaluation  Patient Details  Name: Joanne Booth MRN: [id number] Date of Birth: [birth year] Referring Provider (PT): Lossie Faes, MD   Encounter Date: 09/29/2020   PT End of Session - 09/29/20 1827    Visit Number 1    Number of Visits 6    Date for PT Re-Evaluation 11/10/20    Authorization Type Work comp - (Case manager) Ruthell Rummage  [phone]  [phone]    PT Start Time 1830    PT Stop Time 1920    PT Time Calculation (min) 50 min    Activity Tolerance Patient tolerated treatment well    Behavior During Therapy [facility] for tasks assessed/performed           History reviewed. No pertinent past medical history.  History reviewed. No pertinent surgical history.  There were no vitals filed for this visit.    Subjective Assessment - 09/29/20 1833    Subjective "The first time it happened sometime in August when I was moving my daughter back to school, so I was busy but wasn't really lifting anything. The rest of that day I was really sore. Then it was fine within 2 days. At work when I would reach for things, I would get really bad gnawing back pain and spasming. Then it was okay after I went to the chiropractor. Then it happened again in October and was hurting after I got a massage, but it went away after I went to the chiropractor. And now I was lifting a box 10-15 lbs at work and twisting, and it has been hurting again but is different this time. For almost a week, I was having trouble even getting my pants. It happened on a Monday and was starting to feel a little better the following week after some time and after starting the steroid pack."    How long can you sit comfortably? No limitation    How long can you stand comfortably? No limitation    How long can you walk comfortably? No limitation     Patient Stated Goals Pt wants to be able to return to higher intensity workouts and to move without hesitancy/feeling like she may aggravate symptoms    Currently in Pain? No/denies    Pain Score 0-No pain    Pain Location Back    Pain Orientation Left;Lower    Pain Type Acute pain    Pain Onset 1 to 4 weeks ago    Pain Frequency Intermittent    Aggravating Factors  Has not been hurting much lately    Pain Relieving Factors Steroid pack and time passing since MOI    Effect of Pain on Daily Activities Pt would like to return to PLOF without fear of symptom aggravation              [facility] PT Assessment - 09/29/20 0001      Assessment   Medical Diagnosis Low back pain, unspecified (M54.50)    Referring Provider (PT) Lossie Faes, MD    Onset Date/Surgical Date 09/14/20    Hand Dominance Right    Next MD Visit 09/30/2020    Prior Therapy Yes for hips but not for LBP      Precautions   Precautions None      Restrictions   Weight Bearing Restrictions No      Balance Screen  Has the patient fallen in the past 6 months No    Has the patient had a decrease in activity level because of a fear of falling?  Yes    Is the patient reluctant to leave their home because of a fear of falling?  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    [employer], travel, spend time with family, attend sporting events      Cognition   Overall  Cognitive Status Within Functional Limits for tasks assessed      Observation/Other Assessments   Focus on Therapeutic Outcomes (FOTO)  30% limitation; predicted 18% limited      ROM / Strength   AROM / PROM / Strength AROM;Strength      AROM   Overall AROM Comments BLE AROM WFL    AROM Assessment Site Lumbar    Lumbar Flexion Can place hands on floor    Lumbar Extension WFL and no pain    Lumbar - Right Side Bend To R lateral knee joint line    Lumbar - Left Side Bend To L lateral knee joint line but painful in left low back    Lumbar - Right Rotation WFL    Lumbar - Left Rotation [facility]      Strength   Strength Assessment Site Hip;Knee;Ankle    Right/Left Hip Right;Left    Right Hip Flexion 4-/5    Right Hip ABduction 4/5    Right Hip ADduction 4/5    Left Hip Flexion 4/5    Left Hip ABduction 4/5    Left Hip ADduction 4/5    Right/Left Knee Right;Left    Right Knee Flexion 4/5    Right Knee Extension 4/5    Left Knee Flexion 4/5    Left Knee Extension 4/5    Right/Left Ankle Right;Left    Right Ankle Dorsiflexion 5/5    Right Ankle Plantar Flexion 5/5   modified test in sitting   Left Ankle Dorsiflexion 5/5    Left Ankle Plantar Flexion 5/5   modified test in sitting     Palpation   Palpation comment TTP along lumbosacral region. PSIS/ASIS not level bilaterally      Special Tests   Other special tests Long sit test revealed L anterior innominate rotation from supine to longsitting (left long visibly longer in supine position and L ASIS more cephaled compared to R ASIS). Rotation was still noted but not as significant following MET (L hip EXT and R hip FL). Leg length measurement revealed no discrepancy from each ASIS to medial malleolus (31.5 inches).      High Level Balance   High Level Balance Comments Patient's balance WFL with no LOB during tasks performed throughout evaluation.  Objective measurements completed on examination: See  above findings.       [facility] Adult PT Treatment/Exercise - 09/29/20 0001      Exercises   Exercises Lumbar;Knee/Hip      Lumbar Exercises: Quadruped   Other Quadruped Lumbar Exercises Fire hydrant x 5 each LE      Knee/Hip Exercises: Standing   Other Standing Knee Exercises Lateral stepping with red theraband      Manual Therapy   Manual Therapy Muscle Energy Technique    Muscle Energy Technique L hip EXT and R hip FL 8 x 5 sec                  PT Education - 09/29/20 1959    Education Details Patient education provided regarding HEP, anatomy of condition (skeleton model for visual demonstration of innominate rotation), and POC.    Person(s) Educated Patient    Methods Explanation;Demonstration;Handout;Tactile cues;Verbal cues    Comprehension Verbalized understanding;Returned demonstration;Verbal cues required;Tactile cues required;Need further instruction            PT Short Term Goals - 09/29/20 1944      PT SHORT TERM GOAL #1   Title Patient will be independent with initial HEP.    Baseline Pt was provided initial HEP during evaluation 09/29/2020.    Time 3    Period Weeks    Status New    Target Date 10/20/20      PT SHORT TERM GOAL #2   Title Patient will be able to perform L lumbar/trunk sidebending with 0/10 low back pain.    Baseline Pt reports L-sided LBP during L lumbar sidebending    Time 3    Period Weeks    Status New    Target Date 10/20/20      PT SHORT TERM GOAL #3   Title Patient will demonstrate R hip FL MMT of 4+/5 or better.    Baseline 4-/5    Time 3    Period Weeks    Status New    Target Date 10/20/20             PT Long Term Goals - 09/29/20 1951      PT LONG TERM GOAL #1   Title Patient will be independent with advanced HEP.    Baseline Pt was provided initial HEP during evaluation 09/29/2020.    Time 6    Period Weeks    Status New    Target Date 11/10/20      PT LONG TERM GOAL #2   Title Patient will increase  BLE MMT to 5/5 grossly to allow for better tolerance with higher intensity workouts and tasks.    Baseline 4-/5 R hip FL and 4/5 other BLE MMT except 5/5 ankle strength    Time 6    Period Weeks    Status New    Target Date 11/10/20      PT LONG TERM GOAL #3   Title Patient will no longer present with innominate rotation upon assessing long sit test (or other measures to determine innominate rotation).    Baseline Long sit test revealed L innominate anterior rotation from supine>longistting    Time 6    Period Weeks    Status New    Target Date 11/10/20      PT LONG TERM GOAL #4   Title Pt's FOTO score will improve from 30% limitation to 18% limitation or better.    Baseline 30% limited; predicted 18% limitation (  70% function; predicted 82% function)    Time 6    Period Weeks    Status New    Target Date 11/10/20                  Plan - 09/29/20 1927    Clinical Impression Statement Patient is a 54 year old female who presents to [place] with complaints of lumbosacral pain with most recent onset occurring while lifting and moving (twisting) a 10-15 lb box at work on 09/14/2020. She reports that she can tell where her pain was but has not had severe pain since last week, but she is not sure if ease of symptoms is due to time passed since onset of symptoms or as a result of taking steroid pack. She presents with lumbar AROM WFL but has minimal pain with L sidebending. She demonstrates BLE MMT and AROM WFL but has decreased strength in R hip as a result from chronic hip pain that is worse on the right. Palpation revealed L PSIS being caudal in comparison to R PSIS with patient in prone. Long sit test revealed L anterior innominate rotation from supine to longsitting (left long visibly longer in supine position and L ASIS more cephaled compared to R ASIS). Rotation was still noted but not as significant following MET (L hip EXT and R hip FL). Leg length measurement revealed no discrepancy  from each ASIS to medial malleolus. Pt is hesistant to return to prior level of activity and workouts in fear of symptom aggravation. Pt should benefit from skilled PT intervention to increase core/BLE strength and body mechanics to allow for improved trunk/hip stability and tolerance with work tasks and gym routine.    Personal Factors and Comorbidities Past/Current Experience;Time since onset of injury/illness/exacerbation    Examination-Activity Limitations Lift    Examination-Participation Restrictions Occupation    Stability/Clinical Decision Making Stable/Uncomplicated    Clinical Decision Making Low    Rehab Potential Excellent    PT Frequency 1x / week    PT Duration 6 weeks   work comp approved 6 visits   PT Treatment/Interventions Therapeutic activities;Therapeutic exercise;Functional mobility training;Cryotherapy;Iontophoresis 18m/ml Dexamethasone;Moist Heat;Electrical Stimulation;ADLs/Self Care Home Management;Taping;Dry needling;Passive range of motion;Manual techniques;Neuromuscular re-education;Balance training;Patient/family education    PT Next Visit Plan Review and update HEP PRN, long sit test and/or ASIS/PSIS palpation to assess for innominate rotation. Core/BLE strengthening (pelvic tilts, hip ABD, pallof press, bird dogs) to pt tolerance. Manual techniques if indicated    PT Home Exercise Plan [id number] - fire hydrant, self-MET for innominate rotation, lateral stepping with red theraband    Consulted and Agree with Plan of Care Patient           Patient will benefit from skilled therapeutic intervention in order to improve the following deficits and impairments:  Improper body mechanics,Pain,Increased muscle spasms,Decreased strength,Decreased activity tolerance  Visit Diagnosis: Acute bilateral low back pain without sciatica  Muscle spasm of back     Problem List Patient Active Problem List   Diagnosis Date Noted  . 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  Bilateral hip pain 08/25/2015     KHaydee Monica PT, DPT 09/29/20 8:10 PM  [facility] LP[address]GMen[place] [address]Phone: [phone]  Fax:  [phone] Name: Joanne LEBRONMRN: [id number] of Birth: [birth year]

## 2020-10-07 ENCOUNTER — Ambulatory Visit: Payer: PRIVATE HEALTH INSURANCE

## 2020-10-13 ENCOUNTER — Ambulatory Visit: Payer: PRIVATE HEALTH INSURANCE | Attending: Occupational Medicine

## 2020-10-13 ENCOUNTER — Other Ambulatory Visit: Payer: Self-pay

## 2020-10-13 DIAGNOSIS — M545 Low back pain, unspecified: Secondary | ICD-10-CM | POA: Diagnosis not present

## 2020-10-13 DIAGNOSIS — M62830 Muscle spasm of back: Secondary | ICD-10-CM | POA: Insufficient documentation

## 2020-10-13 NOTE — Therapy (Signed)
[place], [place], [address] Phone: [phone]   Fax:  [phone]  Physical Therapy Treatment/Discharge Summary  Patient Details  Name: Joanne Booth MRN: [id number] Date of Birth: 1966-08-07 Referring Provider (PT): Lossie Faes, MD   Encounter Date: 10/13/2020   PT End of Session - 10/13/20 2242    Visit Number 2    Number of Visits 6    Date for PT Re-Evaluation 11/10/20    Authorization Type Work comp - (Case manager) Ruthell Rummage  [phone]  [phone]    PT Start Time 1537   pt arrived late   PT Stop Time 1623    PT Time Calculation (min) 46 min    Activity Tolerance Patient tolerated treatment well    Behavior During Therapy [facility] for tasks assessed/performed           History reviewed. No pertinent past medical history.  History reviewed. No pertinent surgical history.  There were no vitals filed for this visit.   Subjective Assessment - 10/13/20 1539    Subjective "My legs are a little sore today because I did a Peloton workout on Sunday." Pt reports very minimal to no pain in low back.    How long can you sit comfortably? No limitation    How long can you stand comfortably? No limitation    How long can you walk comfortably? No limitation    Patient Stated Goals Pt wants to be able to return to higher intensity workouts and to move without hesitancy/feeling like she may aggravate symptoms    Currently in Pain? Yes    Pain Score 1     Pain Location Back    Pain Orientation Lower;Left    Pain Descriptors / Indicators Dull    Pain Type Acute pain    Pain Onset 1 to 4 weeks ago              [facility] PT Assessment - 10/13/20 0001      Assessment   Medical Diagnosis Low back pain, unspecified (M54.50)    Referring Provider (PT) Lossie Faes, MD    Onset Date/Surgical Date 09/14/20      Observation/Other Assessments   Focus on Therapeutic Outcomes (FOTO)  94%  function; predicted 82% function      AROM   Overall AROM Comments BLE and lumbar AROM WFL with no pain      Strength   Overall Strength Comments BLE 5/5 grossly with no pain                         [facility] Adult PT Treatment/Exercise - 10/13/20 0001      Ambulation/Gait   Ambulation/Gait Yes    Ambulation Distance (Feet) 360 Feet    Gait Comments Carrying 15# box to mimic work related tasks      Self-Care   Self-Care Other Self-Care Comments    Other Self-Care Comments  Reviewed and updated HEP for independence at home and discussed continuing workout routine as tolerated. 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      Lumbar Exercises: Aerobic   Nustep L6 x 5 min      Lumbar Exercises:  Standing   Lifting Weights (lbs) Deadlifts with 15# dumbbell x 15    Lifting Limitations Lifting 15# box then turning and placing on different mat ~3 feet away x 10 turning in different direction with each repetition to mimic task pt was performing when onset of symptoms occurred      Lumbar Exercises: Seated   Other Seated Lumbar Exercises Swiss ball alternating marches (green) 15x each LE then resisted trunk rotation while seated on swiss ball (green band)      Lumbar Exercises: Quadruped   [place]/Old Horse 20 reps    Other Quadruped Lumbar Exercises Bird dog 15x each UE/LE    Other Quadruped Lumbar Exercises Fire hydrant x 15 each LE with green theraband      Knee/Hip Exercises: Standing   Functional Squat 15 reps    Functional Squat Limitations On BOSU (black)      Manual Therapy   Manual therapy comments No innominate rotation noted with long sit test                  PT Education - 10/13/20 2258    Education Details Reviewed and  updated HEP for independence at home and discussed continuing workout routine as tolerated. 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    Person(s) Educated Patient    Methods Explanation;Demonstration;Handout    Comprehension Verbalized understanding;Returned demonstration            PT Short Term Goals - 10/13/20 2243      PT SHORT TERM GOAL #1   Title Patient will be independent with initial HEP.    Baseline Pt was provided initial HEP during evaluation 09/29/2020.    Time 3    Period Weeks    Status Achieved    Target Date 10/20/20      PT SHORT TERM GOAL #2   Title Patient will be able to perform L lumbar/trunk sidebending with 0/10 low back pain.    Baseline Pt reports L-sided LBP during L lumbar sidebending    Time 3    Period Weeks    Status Achieved    Target Date 10/20/20      PT SHORT TERM GOAL #3   Title Patient will demonstrate R hip FL MMT of 4+/5 or better.    Baseline 4-/5    Time 3    Period Weeks    Status Achieved    Target Date 10/20/20             PT Long Term Goals - 10/13/20 2244      PT LONG TERM GOAL #1   Title Patient will be independent with advanced HEP.    Baseline Pt was provided initial HEP during evaluation 09/29/2020. 10/13/2020: Pt progressed her initial HEP herself and has returned to completing Peloton/high intensity workouts    Time 6    Period Weeks    Status Partially Met      PT LONG TERM GOAL #2   Title Patient will increase BLE MMT to 5/5 grossly to allow for better tolerance with higher intensity workouts and tasks.    Baseline 4-/5 R hip FL and 4/5 other BLE MMT except 5/5 ankle strength    Time 6  Period Weeks    Status Achieved      PT LONG TERM GOAL #3   Title Patient will no  longer present with innominate rotation upon assessing long sit test (or other measures to determine innominate rotation).    Baseline Long sit test revealed L innominate anterior rotation from supine>longistting    Time 6    Period Weeks    Status Achieved      PT LONG TERM GOAL #4   Title Pt's FOTO score will improve from 30% limitation to 18% limitation or better.    Baseline 30% limited; predicted 18% limitation (70% function; predicted 82% function). 10/13/2020: 94% function; predicted 82% function    Time 6    Period Weeks    Status Achieved                 Plan - 10/13/20 2243    Clinical Impression Statement Patient had excellent tolerance to treatment session with no complaints of pain or discomfort. She has been able to tolerate work tasks and higher intensity workouts since intiial evaluation without aggravation of symptoms, and she denies having limitations with activities at this time. Pt did not present with any discrepancy or innominate rotation during long sit test today. She was able to perform interventions similar to work-related tasks without reproduction of symptoms and is pleased with her current functional status. Pt informed that she can obtain new PT referral from doctor if symptoms return as she will be D/C from PT today.    Personal Factors and Comorbidities Past/Current Experience;Time since onset of injury/illness/exacerbation    Examination-Activity Limitations Lift    Examination-Participation Restrictions Occupation    Stability/Clinical Decision Making Stable/Uncomplicated    Rehab Potential Excellent    PT Frequency 1x / week    PT Duration 6 weeks   work comp approved 6 visits   PT Treatment/Interventions Therapeutic activities;Therapeutic exercise;Functional mobility training;Cryotherapy;Iontophoresis 59m/ml Dexamethasone;Moist Heat;Electrical Stimulation;ADLs/Self Care Home Management;Taping;Dry needling;Passive range of motion;Manual  techniques;Neuromuscular re-education;Balance training;Patient/family education    PT Next Visit Plan D/C today    PT Home Exercise Plan [id number] - fire hydrant, self-MET for innominate rotation, lateral stepping with red theraband    Consulted and Agree with Plan of Care Patient           Patient will benefit from skilled therapeutic intervention in order to improve the following deficits and impairments:  Improper body mechanics,Pain,Increased muscle spasms,Decreased strength,Decreased activity tolerance  Visit Diagnosis: Acute bilateral low back pain without sciatica  Muscle spasm of back     Problem List Patient Active Problem List   Diagnosis Date Noted   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  Patient goals were met. Patient is being discharged due to meeting the stated rehab goals.  ?????     Goals achieved and patient pleased with functional level.   KHaydee Monica PT, DPT 10/13/20 11:05 PM  [facility] Center[address] StreetGSanatoga N[place]aska [address]Phone: [phone]  Fax:  [phone] Name: PADYLINE HUBERTYMRN: [id number] of Birth: 207-09-1966

## 2020-11-09 DIAGNOSIS — Z01419 Encounter for gynecological examination (general) (routine) without abnormal findings: Secondary | ICD-10-CM | POA: Diagnosis not present

## 2020-11-09 DIAGNOSIS — Z1231 Encounter for screening mammogram for malignant neoplasm of breast: Secondary | ICD-10-CM | POA: Diagnosis not present

## 2020-11-09 DIAGNOSIS — Z681 Body mass index (BMI) 19 or less, adult: Secondary | ICD-10-CM | POA: Diagnosis not present

## 2020-11-17 ENCOUNTER — Ambulatory Visit: Payer: 59 | Admitting: Neurology

## 2020-11-17 ENCOUNTER — Other Ambulatory Visit: Payer: Self-pay | Admitting: Neurology

## 2020-11-17 ENCOUNTER — Other Ambulatory Visit: Payer: Self-pay

## 2020-11-17 ENCOUNTER — Encounter: Payer: Self-pay | Admitting: Neurology

## 2020-11-17 VITALS — BP 113/69 | HR 63 | Ht 65.0 in | Wt 115.0 lb

## 2020-11-17 DIAGNOSIS — R51 Headache with orthostatic component, not elsewhere classified: Secondary | ICD-10-CM | POA: Diagnosis not present

## 2020-11-17 DIAGNOSIS — G501 Atypical facial pain: Secondary | ICD-10-CM | POA: Diagnosis not present

## 2020-11-17 DIAGNOSIS — R519 Headache, unspecified: Secondary | ICD-10-CM | POA: Diagnosis not present

## 2020-11-17 DIAGNOSIS — G500 Trigeminal neuralgia: Secondary | ICD-10-CM | POA: Diagnosis not present

## 2020-11-17 LAB — HM PAP SMEAR

## 2020-11-17 MED ORDER — GABAPENTIN 100 MG PO CAPS: 100.0000 mg | ORAL_CAPSULE | Freq: Three times a day (TID) | ORAL | 6 refills | Status: DC | PRN

## 2020-11-17 MED FILL — GABAPENTIN 100 MG CAPSULE: 100 | 30 days supply | Qty: 90 | Fill #0

## 2020-11-17 NOTE — Progress Notes (Signed)
[facility]    Provider:  Dr Jaynee Eagles Requesting Provider: Louretta Shorten, MD Primary Care Provider:  Louretta Shorten, MD  CC:  Facial pain and headache  HPI:  Joanne Booth is a 55 y.o. female here as requested by Louretta Shorten, MD for headache. PMHx is non-contributory. I reviewed Dr. Gregor Hams notes which states patient has new onset headache wants neuro referral, headaches to the left frontal side of the head started 3 weeks prior, she saw ENT who said she needed neurology, in reference to requesting complaint.  I reviewed Dr. Felisa Bonier examination which was normal including constitutional, head, neck, lymph nodes, cardiovascular, lungs, breast, abdomen, back, extremities, psychiatric exams.;  Vitals were 110/70 with BMI 18.8.  5 foot 5 inches.  I reviewed notes in epic, patient saw Marshall Cork in ophthalmology in April 2021 diagnosed with hordeolum internum right lower eyelid and abscess of right lower eyelid; on my review I did not see any ear nose and throat notes in epic or in "Care Everywhere".  She does not have a history of migraines, headaches started January 2nd, she can tell when it is going to come on, she feels it behind her left eye and radiates to the back of the head, her nose feels sensitive no drainage, headaches always on the left, the headache is dull, annoying, mildly throbbing headache, 1-4/10, she would get them at night, the ones at night were severe and they scared her, wax and wane, she will have them for a few days and then come back, they can last 4 hours or in the evening sleep helps, no light or sound sensitivity, no aura, she sees Dr. Benjamine Mola and he did not think it is sinus. No inciting events, she has not had covid, her son sees Dr. Tomi Likens and has migraines with aura. Touching the tip of her nose causes radiation to the back of the eye and the headache is in the high parietal area. No other focal neurologic deficits, associated symptoms, inciting events or  modifiable factors.  Meds tried: Amitriptyline   Medications tried:   Reviewed notes, labs and imaging from outside physicians, which showed: see above  Review of Systems: Patient complains of symptoms per HPI as well as the following symptoms: headache Pertinent negatives and positives per HPI. All others negative.   Social History   Socioeconomic History  . Marital status: Married    Spouse name: Not on file  . Number of children: 3  . Years of education: Not on file  . Highest education level: Not on file  Occupational History  . Not on file  Tobacco Use  . Smoking status: Never Smoker  . Smokeless tobacco: Never Used  Vaping Use  . Vaping Use: Never used  Substance and Sexual Activity  . Alcohol use: Yes    Alcohol/week: 0.0 standard drinks    Comment: occasionally   . Drug use: Never  . Sexual activity: Yes  Other Topics Concern  . Not on file  Social History Narrative   Lives home with husband and daughter   Right handed   Caffeine: 1 cup/day   Social Determinants of Health   Financial Resource Strain: Not on file  Food Insecurity: Not on file  Transportation Needs: Not on file  Physical Activity: Not on file  Stress: Not on file  Social Connections: Not on file  Intimate Partner Violence: Not on file    Family History  Problem Relation Age of Onset  . Breast cancer Mother   .  Osteoarthritis Mother   . Osteoarthritis Other   . Migraines Son        started when he was a pre-teen, aura, migraine, "full blown"    Past Medical History:  Diagnosis Date  . Basal cell carcinoma     Patient Active Problem List   Diagnosis Date Noted  . 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 Bilateral hip pain 08/25/2015    Past Surgical History:  Procedure Laterality Date  . basal cell skin cancer removals      Current Outpatient Medications  Medication Sig Dispense Refill  . gabapentin (NEURONTIN) 100 MG capsule Take 1 capsule (100 mg total) by mouth 3  (three) times daily as needed. 90 capsule 6  . ibuprofen (ADVIL,MOTRIN) 200 MG tablet Take 200 mg by mouth every 6 (six) hours as needed.     No current facility-administered medications for this visit.    Allergies as of 11/17/2020  . (No Known Allergies)    Vitals: BP 113/69 (BP Location: Right Arm, Patient Position: Sitting)   Pulse 63   Ht 5\' 5"  (1.651 m)   Wt 115 lb (52.2 kg)   LMP 09/18/2013   BMI 19.14 kg/m  Last Weight:  Wt Readings from Last 1 Encounters:  11/17/20 115 lb (52.2 kg)   Last Height:   Ht Readings from Last 1 Encounters:  11/17/20 5\' 5"  (1.651 m)     Physical exam: Exam: Gen: NAD, conversant, well nourised, well groomed                     CV: RRR, no MRG. No Carotid Bruits. No peripheral edema, warm, nontender Eyes: Conjunctivae clear without exudates or hemorrhage  Neuro: Detailed Neurologic Exam  Speech:    Speech is normal; fluent and spontaneous with normal comprehension.  Cognition:    The patient is oriented to person, place, and time;     recent and remote memory intact;     language fluent;     normal attention, concentration,     fund of knowledge Cranial Nerves:    The pupils are equal, round, and reactive to light. The fundi are flat. Visual fields are full to finger confrontation. Extraocular movements are intact. Trigeminal sensation is intact and the muscles of mastication are normal. The face is symmetric. The palate elevates in the midline. Hearing intact. Voice is normal. Shoulder shrug is normal. The tongue has normal motion without fasciculations.   Coordination:    No dysmetria or ataxia  Gait:  normal native gaot  Motor Observation:    No asymmetry, no atrophy, and no involuntary movements noted. Tone:    Normal muscle tone.    Posture:    Posture is normal. normal erect    Strength:    Strength is V/V in the upper and lower limbs.      Sensation: intact to LT     Reflex Exam:  DTR's:    Deep tendon  reflexes in the upper and lower extremities are brisk for age bilaterally.   Toes:    The toes are downgoing bilaterally.   Clonus:    Clonus is absent.    Assessment/Plan:  Patient with radiating pain/tenderness from the nose to behind the eye(Maxillary nerve trigeminal branch) with associate left high parietal headache. Unclear etiology, started Jan 2nd: NDPH? Atypical trigeminal neuralgia? Or migraine variant? Doesn't sound like cluster headaches. We will order MRI of the brain. Will schedule a follow up in 4-6 weeks but if  it gets worse or persists please let us know. Gabapentin prn.   Orders Placed This Encounter  Procedures  . MR BRAIN W WO CONTRAST  . Comprehensive metabolic panel  . CBC with Differential/Platelets  . TSH   Meds ordered this encounter  Medications  . gabapentin (NEURONTIN) 100 MG capsule    Sig: Take 1 capsule (100 mg total) by mouth 3 (three) times daily as needed.    Dispense:  90 capsule    Refill:  6    Cc: Louretta Shorten, MD,  Louretta Shorten, MD  Sarina Ill, MD  [facility] [address] [place], [place] [address]  Phone [phone] Fax [phone]

## 2020-11-17 NOTE — Patient Instructions (Addendum)
Atypical trigeminal neuralgia? Or migraine variant? Doesn't sound like cluster headaches. We will order MRI of the brain. Will schedule a follow up in 4-6 weeks but if it gets worse or persists please let us know.        Trigeminal Neuralgia  Trigeminal neuralgia is a nerve disorder that causes severe pain on one side of the face. The pain may last from a few seconds to several minutes. The pain is usually only on one side of the face. Symptoms may occur for days, weeks, or months and then go away for months or years. The pain may return and be worse than before. What are the causes? This condition is caused by damage or pressure to a nerve in the head that is called the trigeminal nerve. An attack can be triggered by:  Talking.  Chewing.  Putting on makeup.  Washing your face.  Shaving your face.  Brushing your teeth.  Touching your face. What increases the risk? You are more likely to develop this condition if you:  Are 57 years of age or older.  Are female. What are the signs or symptoms? The main symptom of this condition is severe pain in the:  Jaw.  Lips.  Eyes.  Nose.  Scalp.  Forehead.  Face. The pain may be:  Intense.  Stabbing.  Electric.  Shock-like. How is this diagnosed? This condition is diagnosed with a physical exam. A CT scan or an MRI may be done to rule out other conditions that can cause facial pain. How is this treated? This condition may be treated with:  Avoiding the things that trigger your symptoms.  Taking prescription medicines (anticonvulsants).  Having surgery. This may be done in severe cases if other medical treatment does not provide relief.  Having procedures such as ablation, thermal, or radiation therapy. It may take up to one month for treatment to start relieving the pain. Follow these instructions at home: Managing pain  Learn as much as you can about how to manage your pain. Ask your health care  provider if a pain specialist would be helpful.  Consider talking with a mental health care provider (psychologist) about how to cope with the pain.  Consider joining a pain support group. General instructions  Take over-the-counter and prescription medicines only as told by your health care provider.  Avoid the things that trigger your symptoms. It may help to: ? Chew on the unaffected side of your mouth. ? Avoid touching your face. ? Avoid blasts of hot or cold air.  Follow your treatment plan as told by your health care provider. This may include: ? Cognitive or behavioral therapy. ? Gentle, regular exercise. ? Meditation or yoga. ? Aromatherapy.  Keep all follow-up visits as told by your health care provider. You may need to be monitored closely to make sure treatment is working well for you. Where to find more information  Facial Pain Association: fpa-support.org Contact a health care provider if:  Your medicine is not helping your symptoms.  You have side effects from the medicine used for treatment.  You develop new, unexplained symptoms, such as: ? Double vision. ? Facial weakness. ? Facial numbness. ? Changes in hearing or balance.  You feel depressed. Get help right away if:  Your pain is severe and is not getting better.  You develop suicidal thoughts. If you ever feel like you may hurt yourself or others, or have thoughts about taking your own life, get help right away. You can  go to your nearest emergency department or call:  Your local emergency services (911 in the [place]  A suicide crisis helpline, such as the National Suicide Prevention Lifeline at 331-388-6837. This is open 24 hours a day. Summary  Trigeminal neuralgia is a nerve disorder that causes severe pain on one side of the face. The pain may last from a few seconds to several minutes.  This condition is caused by damage or pressure to a nerve in the head that is called the trigeminal  nerve.  Treatment may include avoiding the things that trigger your symptoms, taking medicines, or having surgery or procedures. It may take up to one month for treatment to start relieving the pain.  Avoid the things that trigger your symptoms.  Keep all follow-up visits as told by your health care provider. You may need to be monitored closely to make sure treatment is working well for you. This information is not intended to replace advice given to you by your health care provider. Make sure you discuss any questions you have with your health care provider. Document Revised: 08/20/2018 Document Reviewed: 08/20/2018 Elsevier Patient Education  2021 Elsevier Inc. Gabapentin capsules or tablets What is this medicine? GABAPENTIN (GA ba pen tin) is used to control seizures in certain types of epilepsy. It is also used to treat certain types of nerve pain. This medicine may be used for other purposes; ask your health care provider or pharmacist if you have questions. COMMON BRAND NAME(S): Active-PAC with Gabapentin, Ascencion Dike, Gralise, Neurontin What should I tell my health care provider before I take this medicine? They need to know if you have any of these conditions:  history of drug abuse or alcohol abuse problem  kidney disease  lung or breathing disease  suicidal thoughts, plans, or attempt; a previous suicide attempt by you or a family member  an unusual or allergic reaction to gabapentin, other medicines, foods, dyes, or preservatives  pregnant or trying to get pregnant  breast-feeding How should I use this medicine? Take this medicine by mouth with a glass of water. Follow the directions on the prescription label. You can take it with or without food. If it upsets your stomach, take it with food. Take your medicine at regular intervals. Do not take it more often than directed. Do not stop taking except on your doctor's advice. If you are directed to break the 600 or 800 mg  tablets in half as part of your dose, the extra half tablet should be used for the next dose. If you have not used the extra half tablet within 28 days, it should be thrown away. A special MedGuide will be given to you by the pharmacist with each prescription and refill. Be sure to read this information carefully each time. Talk to your pediatrician regarding the use of this medicine in children. While this drug may be prescribed for children as young as 3 years for selected conditions, precautions do apply. Overdosage: If you think you have taken too much of this medicine contact a poison control center or emergency room at once. NOTE: This medicine is only for you. Do not share this medicine with others. What if I miss a dose? If you miss a dose, take it as soon as you can. If it is almost time for your next dose, take only that dose. Do not take double or extra doses. What may interact with this medicine? This medicine may interact with the following medications:  alcohol  antihistamines  for allergy, cough, and cold  certain medicines for anxiety or sleep  certain medicines for depression like amitriptyline, fluoxetine, sertraline  certain medicines for seizures like phenobarbital, primidone  certain medicines for stomach problems  general anesthetics like halothane, isoflurane, methoxyflurane, propofol  local anesthetics like lidocaine, pramoxine, tetracaine  medicines that relax muscles for surgery  narcotic medicines for pain  phenothiazines like chlorpromazine, mesoridazine, prochlorperazine, thioridazine This list may not describe all possible interactions. Give your health care provider a list of all the medicines, herbs, non-prescription drugs, or dietary supplements you use. Also tell them if you smoke, drink alcohol, or use illegal drugs. Some items may interact with your medicine. What should I watch for while using this medicine? Visit your doctor or health care  provider for regular checks on your progress. You may want to keep a record at home of how you feel your condition is responding to treatment. You may want to share this information with your doctor or health care provider at each visit. You should contact your doctor or health care provider if your seizures get worse or if you have any new types of seizures. Do not stop taking this medicine or any of your seizure medicines unless instructed by your doctor or health care provider. Stopping your medicine suddenly can increase your seizures or their severity. This medicine may cause serious skin reactions. They can happen weeks to months after starting the medicine. Contact your health care provider right away if you notice fevers or flu-like symptoms with a rash. The rash may be red or purple and then turn into blisters or peeling of the skin. Or, you might notice a red rash with swelling of the face, lips or lymph nodes in your neck or under your arms. Wear a medical identification bracelet or chain if you are taking this medicine for seizures, and carry a card that lists all your medications. You may get drowsy, dizzy, or have blurred vision. Do not drive, use machinery, or do anything that needs mental alertness until you know how this medicine affects you. To reduce dizzy or fainting spells, do not sit or stand up quickly, especially if you are an older patient. Alcohol can increase drowsiness and dizziness. Avoid alcoholic drinks. Your mouth may get dry. Chewing sugarless gum or sucking hard candy, and drinking plenty of water will help. The use of this medicine may increase the chance of suicidal thoughts or actions. Pay special attention to how you are responding while on this medicine. Any worsening of mood, or thoughts of suicide or dying should be reported to your health care provider right away. Women who become pregnant while using this medicine may enroll in the [place]  Pregnancy Registry by calling 509-708-9769. This registry collects information about the safety of antiepileptic drug use during pregnancy. What side effects may I notice from receiving this medicine? Side effects that you should report to your doctor or health care professional as soon as possible:  allergic reactions like skin rash, itching or hives, swelling of the face, lips, or tongue  breathing problems  rash, fever, and swollen lymph nodes  redness, blistering, peeling or loosening of the skin, including inside the mouth  suicidal thoughts, mood changes Side effects that usually do not require medical attention (report to your doctor or health care professional if they continue or are bothersome):  dizziness  drowsiness  headache  nausea, vomiting  swelling of ankles, feet, hands  tiredness This list may not  describe all possible side effects. Call your doctor for medical advice about side effects. You may report side effects to FDA at 1-800-FDA-1088. Where should I keep my medicine? Keep out of reach of children. This medicine may cause accidental overdose and death if it taken by other adults, children, or pets. Mix any unused medicine with a substance like cat litter or coffee grounds. Then throw the medicine away in a sealed container like a sealed bag or a coffee can with a lid. Do not use the medicine after the expiration date. Store at room temperature between 15 and 30 degrees C (59 and 86 degrees F). NOTE: This sheet is a summary. It may not cover all possible information. If you have questions about this medicine, talk to your doctor, pharmacist, or health care provider.  2021 Elsevier/Gold Standard (2019-01-04 14:16:43)

## 2020-11-18 ENCOUNTER — Telehealth: Payer: Self-pay | Admitting: Neurology

## 2020-11-18 LAB — COMPREHENSIVE METABOLIC PANEL
ALT: 17 IU/L (ref 0–32)
AST: 20 IU/L (ref 0–40)
Albumin/Globulin Ratio: 2 (ref 1.2–2.2)
Albumin: 4.5 g/dL (ref 3.8–4.9)
Alkaline Phosphatase: 52 IU/L (ref 44–121)
BUN/Creatinine Ratio: 13 (ref 9–23)
BUN: 13 mg/dL (ref 6–24)
Bilirubin Total: 0.4 mg/dL (ref 0.0–1.2)
CO2: 25 mmol/L (ref 20–29)
Calcium: 9.5 mg/dL (ref 8.7–10.2)
Chloride: 102 mmol/L (ref 96–106)
Creatinine, Ser: 0.98 mg/dL (ref 0.57–1.00)
GFR calc Af Amer: 74 mL/min/{1.73_m2} (ref >59)
GFR calc non Af Amer: 64 mL/min/{1.73_m2} (ref >59)
Globulin, Total: 2.2 g/dL (ref 1.5–4.5)
Glucose: 86 mg/dL (ref 65–99)
Potassium: 4.3 mmol/L (ref 3.5–5.2)
Sodium: 139 mmol/L (ref 134–144)
Total Protein: 6.7 g/dL (ref 6.0–8.5)

## 2020-11-18 LAB — CBC WITH DIFFERENTIAL/PLATELET
Basophils Absolute: 0 10*3/uL (ref 0.0–0.2)
Basos: 1 %
EOS (ABSOLUTE): 0.1 10*3/uL (ref 0.0–0.4)
Eos: 2 %
Hematocrit: 40.6 % (ref 34.0–46.6)
Hemoglobin: 13.7 g/dL (ref 11.1–15.9)
Immature Grans (Abs): 0 10*3/uL (ref 0.0–0.1)
Immature Granulocytes: 0 %
Lymphocytes Absolute: 1.1 10*3/uL (ref 0.7–3.1)
Lymphs: 25 %
MCH: 31.1 pg (ref 26.6–33.0)
MCHC: 33.7 g/dL (ref 31.5–35.7)
MCV: 92 fL (ref 79–97)
Monocytes Absolute: 0.5 10*3/uL (ref 0.1–0.9)
Monocytes: 10 %
Neutrophils Absolute: 2.8 10*3/uL (ref 1.4–7.0)
Neutrophils: 62 %
Platelets: 253 10*3/uL (ref 150–450)
RBC: 4.4 x10E6/uL (ref 3.77–5.28)
RDW: 11.7 % (ref 11.7–15.4)
WBC: 4.5 10*3/uL (ref 3.4–10.8)

## 2020-11-18 LAB — TSH: TSH: 2.03 u[IU]/mL (ref 0.450–4.500)

## 2020-11-18 NOTE — Telephone Encounter (Signed)
LVM for pt to call back about scheduling mri  [facility] auth: [facility] Ref # [id number]

## 2020-11-19 NOTE — Telephone Encounter (Signed)
She has now rescheduled for 11/25/20 at [facility]>.

## 2020-11-19 NOTE — Telephone Encounter (Signed)
Just an FYI since you wanted to know when the patient is scheduled.   Patient returned my call. No to the covid questions right now the patient is scheduled at [facility] for 11/24/20. She told me if she couldn't do that day she would call me to reschedule.

## 2020-11-24 ENCOUNTER — Other Ambulatory Visit: Payer: 59

## 2020-11-24 ENCOUNTER — Telehealth: Payer: Self-pay | Admitting: Neurology

## 2020-11-24 NOTE — Telephone Encounter (Signed)
Spoke with patient and advised Dr Jaynee Eagles still recommends she get the MRI. She verbalized understanding and her questions were answered.

## 2020-11-24 NOTE — Telephone Encounter (Signed)
I would still recommend it thanks

## 2020-11-24 NOTE — Telephone Encounter (Signed)
Pt states she is no longer having the headaches and is questioning if she should still have the MRI in the morning.  Please call

## 2020-11-25 ENCOUNTER — Ambulatory Visit: Payer: 59

## 2020-11-25 DIAGNOSIS — G501 Atypical facial pain: Secondary | ICD-10-CM | POA: Diagnosis not present

## 2020-11-25 DIAGNOSIS — R519 Headache, unspecified: Secondary | ICD-10-CM

## 2020-11-25 DIAGNOSIS — G500 Trigeminal neuralgia: Secondary | ICD-10-CM | POA: Diagnosis not present

## 2020-11-25 DIAGNOSIS — R51 Headache with orthostatic component, not elsewhere classified: Secondary | ICD-10-CM

## 2020-11-25 MED ORDER — GADOBENATE DIMEGLUMINE 529 MG/ML IV SOLN
10.0000 mL | Freq: Once | INTRAVENOUS | Status: AC | PRN
  Administered 2020-11-25: 10 mL via INTRAVENOUS

## 2020-11-27 ENCOUNTER — Telehealth: Payer: Self-pay | Admitting: Neurology

## 2020-11-27 NOTE — Telephone Encounter (Signed)
(  Bethany:Plesae call patient and make sure she saw and understood the following and set up an appointment please, sent this in a phone note to you as well), your brain looks very nice. There is a white matter "spot" that doesn't appear concerning, ares like this can be normal in aging but I would want to keep a close eye on it and repeat an MRI on about 3-4 months.There is a little blood vessels right next to the trigeminal nerve that can be causing that shooting pain in your face. I will ask Romelle Starcher to call you for a follow up appointment this week or next week so I can show your MRI and also see how you are doing with your headache. Thanks, Dr. Jaynee Eagles  MRI brain 11/25/2020: -Nonenhancing left frontal periventricular T2 flair hyperintensity measuring 1.2 cm.  This is nonspecific but can be seen with autoimmune, inflammatory, demyelinating, post-infectious or vascular etiologies. -Tiny vascular structures noted within proximity to left trigeminal nerve cisternal segment, but without clear mass-effect or displacement of the left trigeminal nerve.   - No acute findings.

## 2020-11-30 NOTE — Telephone Encounter (Signed)
Spoke with patient. She saw MRI results on mychart. Pt will come next Monday 2/21 at 3:30pm for ov with Dr Jaynee Eagles to discuss results and follow-up on her headaches. Pt hasn't had a headache in just over 2 weeks. Pt's questions were answered. She verbalized appreciation for the call.

## 2020-11-30 NOTE — Telephone Encounter (Signed)
We currently have an opening for this Wednesday afternoon. Will check with patient and see if this works for her.

## 2020-12-07 ENCOUNTER — Encounter: Payer: Self-pay | Admitting: Neurology

## 2020-12-07 ENCOUNTER — Ambulatory Visit: Payer: 59 | Admitting: Neurology

## 2020-12-07 DIAGNOSIS — G509 Disorder of trigeminal nerve, unspecified: Secondary | ICD-10-CM

## 2020-12-07 DIAGNOSIS — R9082 White matter disease, unspecified: Secondary | ICD-10-CM | POA: Diagnosis not present

## 2020-12-07 NOTE — Progress Notes (Signed)
[facility]    Provider:  Dr Jaynee Eagles Requesting Provider: Louretta Shorten, MD Primary Care Provider:  Louretta Shorten, MD  CC:  Facial pain and headache  Patient has not had any headaches since last being seen, we reviewed MRI brian in detail and will repeat in about 6 months. I answered all questions, we reviewed many of the images together.   IMPRESSION:   MRI brain with and without contrast demonstrating: -Nonenhancing left frontal periventricular T2 flair hyperintensity measuring 1.2 cm.  This is nonspecific but can be seen with autoimmune, inflammatory, demyelinating, post-infectious or vascular etiologies. -Tiny vascular structures noted within proximity to left trigeminal nerve cisternal segment, but without clear mass-effect or displacement of the left trigeminal nerve.   - No acute findings.  HPI:  Joanne Booth is a 55 y.o. female here as requested by Louretta Shorten, MD for headache. PMHx is non-contributory. I reviewed Dr. Gregor Hams notes which states patient has new onset headache wants neuro referral, headaches to the left frontal side of the head started 3 weeks prior, she saw ENT who said she needed neurology, in reference to requesting complaint.  I reviewed Dr. Felisa Bonier examination which was normal including constitutional, head, neck, lymph nodes, cardiovascular, lungs, breast, abdomen, back, extremities, psychiatric exams.;  Vitals were 110/70 with BMI 18.8.  5 foot 5 inches.  I reviewed notes in epic, patient saw Marshall Cork in ophthalmology in April 2021 diagnosed with hordeolum internum right lower eyelid and abscess of right lower eyelid; on my review I did not see any ear nose and throat notes in epic or in "Care Everywhere".  She does not have a history of migraines, headaches started January 2nd, she can tell when it is going to come on, she feels it behind her left eye and radiates to the back of the head, her nose feels sensitive no drainage, headaches  always on the left, the headache is dull, annoying, mildly throbbing headache, 1-4/10, she would get them at night, the ones at night were severe and they scared her, wax and wane, she will have them for a few days and then come back, they can last 4 hours or in the evening sleep helps, no light or sound sensitivity, no aura, she sees Dr. Benjamine Mola and he did not think it is sinus. No inciting events, she has not had covid, her son sees Dr. Tomi Likens and has migraines with aura. Touching the tip of her nose causes radiation to the back of the eye and the headache is in the high parietal area. No other focal neurologic deficits, associated symptoms, inciting events or modifiable factors.  Meds tried: Amitriptyline   Medications tried:   Reviewed notes, labs and imaging from outside physicians, which showed: see above  Review of Systems: Patient complains of symptoms per HPI as well as the following symptoms: headache Pertinent negatives and positives per HPI. All others negative.   Social History   Socioeconomic History  . Marital status: Married    Spouse name: Not on file  . Number of children: 3  . Years of education: Not on file  . Highest education level: Not on file  Occupational History  . Not on file  Tobacco Use  . Smoking status: Never Smoker  . Smokeless tobacco: Never Used  Vaping Use  . Vaping Use: Never used  Substance and Sexual Activity  . Alcohol use: Yes    Alcohol/week: 0.0 standard drinks    Comment: occasionally   . Drug use:  Never  . Sexual activity: Yes  Other Topics Concern  . Not on file  Social History Narrative   Lives home with husband and daughter   Right handed   Caffeine: 1 cup/day   Social Determinants of Health   Financial Resource Strain: Not on file  Food Insecurity: Not on file  Transportation Needs: Not on file  Physical Activity: Not on file  Stress: Not on file  Social Connections: Not on file  Intimate Partner Violence: Not on file     Family History  Problem Relation Age of Onset  . Breast cancer Mother   . Osteoarthritis Mother   . Osteoarthritis Other   . Migraines Son        started when he was a pre-teen, aura, migraine, "full blown"    Past Medical History:  Diagnosis Date  . Basal cell carcinoma     Patient Active Problem List   Diagnosis Date Noted  . 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 Bilateral hip pain 08/25/2015    Past Surgical History:  Procedure Laterality Date  . basal cell skin cancer removals      Current Outpatient Medications  Medication Sig Dispense Refill  . gabapentin (NEURONTIN) 100 MG capsule Take 1 capsule (100 mg total) by mouth 3 (three) times daily as needed. (Patient not taking: Reported on 12/07/2020) 90 capsule 6  . ibuprofen (ADVIL,MOTRIN) 200 MG tablet Take 200 mg by mouth every 6 (six) hours as needed. (Patient not taking: Reported on 12/07/2020)     No current facility-administered medications for this visit.    Allergies as of 12/07/2020  . (No Known Allergies)    Vitals: LMP 09/18/2013  Last Weight:  Wt Readings from Last 1 Encounters:  11/17/20 115 lb (52.2 kg)   Last Height:   Ht Readings from Last 1 Encounters:  11/17/20 5\' 5"  (1.651 m)     Physical exam: Exam: Gen: NAD, conversant, well nourised, well groomed                     CV: RRR, no MRG. No Carotid Bruits. No peripheral edema, warm, nontender Eyes: Conjunctivae clear without exudates or hemorrhage  Neuro: Detailed Neurologic Exam  Speech:    Speech is normal; fluent and spontaneous with normal comprehension.  Cognition:    The patient is oriented to person, place, and time;     recent and remote memory intact;     language fluent;     normal attention, concentration,     fund of knowledge Cranial Nerves:    The pupils are equal, round, and reactive to light. The fundi are flat. Visual fields are full to finger confrontation. Extraocular movements are intact. Trigeminal  sensation is intact and the muscles of mastication are normal. The face is symmetric. The palate elevates in the midline. Hearing intact. Voice is normal. Shoulder shrug is normal. The tongue has normal motion without fasciculations.   Coordination:    No dysmetria or ataxia  Gait:  normal native gaot  Motor Observation:    No asymmetry, no atrophy, and no involuntary movements noted. Tone:    Normal muscle tone.    Posture:    Posture is normal. normal erect    Strength:    Strength is V/V in the upper and lower limbs.      Sensation: intact to LT     Reflex Exam:  DTR's:    Deep tendon reflexes in the upper and lower extremities are  brisk for age bilaterally.   Toes:    The toes are downgoing bilaterally.   Clonus:    Clonus is absent.    Assessment/Plan:  Patient with radiating pain/tenderness from the nose to behind the eye(Maxillary nerve trigeminal branch) with associate left high parietal headache. Unclear etiology, started Jan 2nd: NDPH? Atypical trigeminal neuralgia? Or migraine variant? Doesn't sound like cluster headaches.  Gabapentin prn.   Patient has not had any headaches since last being seen, we reviewed MRI Brain in detail and will repeat in about 6 months to follow the white matter hyperintensity. Will follow trigem symptoms clinically.  I answered all questions, we reviewed many of the images together.   IMPRESSION:   MRI brain with and without contrast demonstrating: -Nonenhancing left frontal periventricular T2 flair hyperintensity measuring 1.2 cm.  This is nonspecific but can be seen with autoimmune, inflammatory, demyelinating, post-infectious or vascular etiologies. -Tiny vascular structures noted within proximity to left trigeminal nerve cisternal segment, but without clear mass-effect or displacement of the left trigeminal nerve.   - No acute findings.    No orders of the defined types were placed in this encounter.  No orders of the defined  types were placed in this encounter.   Cc: Louretta Shorten, MD,  Louretta Shorten, MD  Sarina Ill, MD  [facility] [address] [place], [place] [address]  Phone [phone] Fax [phone]   I spent over 40 minutes of face-to-face and non-face-to-face time with patient on the  1. Disorder of left trigeminal nerve   2. White matter abnormality on MRI of brain    diagnosis.  This included previsit chart review, lab review, study review, order entry, electronic health record documentation, patient education on the different diagnostic and therapeutic options, counseling and coordination of care, risks and benefits of management, compliance, or risk factor reduction

## 2020-12-15 DIAGNOSIS — G43B0 Ophthalmoplegic migraine, not intractable: Secondary | ICD-10-CM | POA: Diagnosis not present

## 2020-12-15 DIAGNOSIS — H04123 Dry eye syndrome of bilateral lacrimal glands: Secondary | ICD-10-CM | POA: Diagnosis not present

## 2020-12-15 DIAGNOSIS — H25013 Cortical age-related cataract, bilateral: Secondary | ICD-10-CM | POA: Diagnosis not present

## 2020-12-15 DIAGNOSIS — H2513 Age-related nuclear cataract, bilateral: Secondary | ICD-10-CM | POA: Diagnosis not present

## 2020-12-30 ENCOUNTER — Ambulatory Visit: Payer: 59 | Admitting: Neurology

## 2021-01-20 DIAGNOSIS — Z13820 Encounter for screening for osteoporosis: Secondary | ICD-10-CM | POA: Diagnosis not present

## 2021-03-24 ENCOUNTER — Other Ambulatory Visit (HOSPITAL_COMMUNITY): Payer: Self-pay

## 2021-03-24 DIAGNOSIS — C801 Malignant (primary) neoplasm, unspecified: Secondary | ICD-10-CM | POA: Insufficient documentation

## 2021-03-24 DIAGNOSIS — J019 Acute sinusitis, unspecified: Secondary | ICD-10-CM | POA: Insufficient documentation

## 2021-03-24 DIAGNOSIS — Z85828 Personal history of other malignant neoplasm of skin: Secondary | ICD-10-CM | POA: Insufficient documentation

## 2021-03-24 MED ORDER — AMOXICILLIN-POT CLAVULANATE 875-125 MG PO TABS
1.0000 | ORAL_TABLET | Freq: Two times a day (BID) | ORAL | 0 refills | Status: AC
  Filled 2021-03-24: qty 20, 10d supply, fill #0

## 2021-05-17 DIAGNOSIS — M9903 Segmental and somatic dysfunction of lumbar region: Secondary | ICD-10-CM | POA: Diagnosis not present

## 2021-05-17 DIAGNOSIS — M9902 Segmental and somatic dysfunction of thoracic region: Secondary | ICD-10-CM | POA: Diagnosis not present

## 2021-05-17 DIAGNOSIS — M9905 Segmental and somatic dysfunction of pelvic region: Secondary | ICD-10-CM | POA: Diagnosis not present

## 2021-05-17 DIAGNOSIS — M62830 Muscle spasm of back: Secondary | ICD-10-CM | POA: Diagnosis not present

## 2021-05-25 DIAGNOSIS — L821 Other seborrheic keratosis: Secondary | ICD-10-CM | POA: Diagnosis not present

## 2021-05-25 DIAGNOSIS — Z85828 Personal history of other malignant neoplasm of skin: Secondary | ICD-10-CM | POA: Diagnosis not present

## 2021-05-25 DIAGNOSIS — D485 Neoplasm of uncertain behavior of skin: Secondary | ICD-10-CM | POA: Diagnosis not present

## 2021-05-25 DIAGNOSIS — D2261 Melanocytic nevi of right upper limb, including shoulder: Secondary | ICD-10-CM | POA: Diagnosis not present

## 2021-05-25 DIAGNOSIS — D2271 Melanocytic nevi of right lower limb, including hip: Secondary | ICD-10-CM | POA: Diagnosis not present

## 2021-05-25 DIAGNOSIS — D1801 Hemangioma of skin and subcutaneous tissue: Secondary | ICD-10-CM | POA: Diagnosis not present

## 2021-05-25 DIAGNOSIS — D2272 Melanocytic nevi of left lower limb, including hip: Secondary | ICD-10-CM | POA: Diagnosis not present

## 2021-05-25 DIAGNOSIS — L812 Freckles: Secondary | ICD-10-CM | POA: Diagnosis not present

## 2021-05-25 DIAGNOSIS — D2262 Melanocytic nevi of left upper limb, including shoulder: Secondary | ICD-10-CM | POA: Diagnosis not present

## 2021-05-25 DIAGNOSIS — C44719 Basal cell carcinoma of skin of left lower limb, including hip: Secondary | ICD-10-CM | POA: Diagnosis not present

## 2021-05-26 DIAGNOSIS — M47816 Spondylosis without myelopathy or radiculopathy, lumbar region: Secondary | ICD-10-CM | POA: Diagnosis not present

## 2021-05-26 DIAGNOSIS — M419 Scoliosis, unspecified: Secondary | ICD-10-CM | POA: Diagnosis not present

## 2021-06-01 DIAGNOSIS — M9902 Segmental and somatic dysfunction of thoracic region: Secondary | ICD-10-CM | POA: Diagnosis not present

## 2021-06-01 DIAGNOSIS — M62830 Muscle spasm of back: Secondary | ICD-10-CM | POA: Diagnosis not present

## 2021-06-01 DIAGNOSIS — M9905 Segmental and somatic dysfunction of pelvic region: Secondary | ICD-10-CM | POA: Diagnosis not present

## 2021-06-01 DIAGNOSIS — M9903 Segmental and somatic dysfunction of lumbar region: Secondary | ICD-10-CM | POA: Diagnosis not present

## 2021-06-09 DIAGNOSIS — Z681 Body mass index (BMI) 19 or less, adult: Secondary | ICD-10-CM | POA: Diagnosis not present

## 2021-06-09 DIAGNOSIS — M5440 Lumbago with sciatica, unspecified side: Secondary | ICD-10-CM | POA: Diagnosis not present

## 2021-06-09 DIAGNOSIS — Z13828 Encounter for screening for other musculoskeletal disorder: Secondary | ICD-10-CM | POA: Diagnosis not present

## 2021-06-09 DIAGNOSIS — M4126 Other idiopathic scoliosis, lumbar region: Secondary | ICD-10-CM | POA: Diagnosis not present

## 2021-06-09 DIAGNOSIS — M5416 Radiculopathy, lumbar region: Secondary | ICD-10-CM | POA: Diagnosis not present

## 2021-06-09 DIAGNOSIS — R030 Elevated blood-pressure reading, without diagnosis of hypertension: Secondary | ICD-10-CM | POA: Diagnosis not present

## 2021-06-10 ENCOUNTER — Other Ambulatory Visit (HOSPITAL_COMMUNITY): Payer: Self-pay

## 2021-06-11 ENCOUNTER — Other Ambulatory Visit (HOSPITAL_COMMUNITY): Payer: Self-pay

## 2021-06-11 MED ORDER — DICLOFENAC SODIUM 75 MG PO TBEC
75.0000 mg | DELAYED_RELEASE_TABLET | Freq: Two times a day (BID) | ORAL | 1 refills | Status: DC
  Filled 2021-06-11: qty 60, 30d supply, fill #0
  Filled 2022-05-16: qty 60, 30d supply, fill #1

## 2021-06-28 DIAGNOSIS — M5416 Radiculopathy, lumbar region: Secondary | ICD-10-CM | POA: Diagnosis not present

## 2021-07-02 DIAGNOSIS — M5416 Radiculopathy, lumbar region: Secondary | ICD-10-CM | POA: Insufficient documentation

## 2021-07-05 DIAGNOSIS — M5416 Radiculopathy, lumbar region: Secondary | ICD-10-CM | POA: Diagnosis not present

## 2021-07-13 DIAGNOSIS — M5416 Radiculopathy, lumbar region: Secondary | ICD-10-CM | POA: Diagnosis not present

## 2021-07-19 DIAGNOSIS — M5416 Radiculopathy, lumbar region: Secondary | ICD-10-CM | POA: Diagnosis not present

## 2021-07-27 DIAGNOSIS — M545 Low back pain, unspecified: Secondary | ICD-10-CM | POA: Diagnosis not present

## 2021-07-27 DIAGNOSIS — M5416 Radiculopathy, lumbar region: Secondary | ICD-10-CM | POA: Diagnosis not present

## 2021-08-02 DIAGNOSIS — M5416 Radiculopathy, lumbar region: Secondary | ICD-10-CM | POA: Diagnosis not present

## 2021-08-04 ENCOUNTER — Other Ambulatory Visit (HOSPITAL_COMMUNITY): Payer: Self-pay

## 2021-08-04 DIAGNOSIS — M4126 Other idiopathic scoliosis, lumbar region: Secondary | ICD-10-CM | POA: Diagnosis not present

## 2021-08-04 MED ORDER — CYCLOBENZAPRINE HCL 5 MG PO TABS
5.0000 mg | ORAL_TABLET | Freq: Three times a day (TID) | ORAL | 1 refills | Status: DC | PRN
  Filled 2021-08-04: qty 40, 14d supply, fill #0

## 2021-08-04 MED ORDER — METHYLPREDNISOLONE 4 MG PO TBPK
ORAL_TABLET | ORAL | 0 refills | Status: DC
  Filled 2021-08-04: qty 21, 6d supply, fill #0

## 2021-08-05 ENCOUNTER — Other Ambulatory Visit (HOSPITAL_COMMUNITY): Payer: Self-pay

## 2021-08-16 DIAGNOSIS — M5416 Radiculopathy, lumbar region: Secondary | ICD-10-CM | POA: Diagnosis not present

## 2021-08-30 DIAGNOSIS — M5416 Radiculopathy, lumbar region: Secondary | ICD-10-CM | POA: Diagnosis not present

## 2021-09-20 DIAGNOSIS — M5416 Radiculopathy, lumbar region: Secondary | ICD-10-CM | POA: Diagnosis not present

## 2021-10-05 DIAGNOSIS — M5416 Radiculopathy, lumbar region: Secondary | ICD-10-CM | POA: Diagnosis not present

## 2021-11-15 DIAGNOSIS — M4126 Other idiopathic scoliosis, lumbar region: Secondary | ICD-10-CM | POA: Diagnosis not present

## 2021-11-15 DIAGNOSIS — R030 Elevated blood-pressure reading, without diagnosis of hypertension: Secondary | ICD-10-CM | POA: Diagnosis not present

## 2021-11-18 ENCOUNTER — Other Ambulatory Visit (HOSPITAL_COMMUNITY): Payer: Self-pay

## 2021-11-18 DIAGNOSIS — L719 Rosacea, unspecified: Secondary | ICD-10-CM | POA: Diagnosis not present

## 2021-11-18 DIAGNOSIS — H00025 Hordeolum internum left lower eyelid: Secondary | ICD-10-CM | POA: Diagnosis not present

## 2021-11-18 MED ORDER — NEOMYCIN-POLYMYXIN-DEXAMETH 3.5-10000-0.1 OP OINT
1.0000 "application " | TOPICAL_OINTMENT | Freq: Three times a day (TID) | OPHTHALMIC | 0 refills | Status: DC
  Filled 2021-11-18: qty 3.5, 10d supply, fill #0

## 2021-11-18 MED ORDER — DOXYCYCLINE MONOHYDRATE 100 MG PO TABS
100.0000 mg | ORAL_TABLET | Freq: Two times a day (BID) | ORAL | 0 refills | Status: DC
  Filled 2021-11-18: qty 10, 5d supply, fill #0

## 2021-12-20 DIAGNOSIS — M5459 Other low back pain: Secondary | ICD-10-CM | POA: Diagnosis not present

## 2021-12-27 DIAGNOSIS — M9903 Segmental and somatic dysfunction of lumbar region: Secondary | ICD-10-CM | POA: Diagnosis not present

## 2021-12-27 DIAGNOSIS — M62830 Muscle spasm of back: Secondary | ICD-10-CM | POA: Diagnosis not present

## 2021-12-27 DIAGNOSIS — M9902 Segmental and somatic dysfunction of thoracic region: Secondary | ICD-10-CM | POA: Diagnosis not present

## 2021-12-27 DIAGNOSIS — M9905 Segmental and somatic dysfunction of pelvic region: Secondary | ICD-10-CM | POA: Diagnosis not present

## 2022-01-17 DIAGNOSIS — M5459 Other low back pain: Secondary | ICD-10-CM | POA: Diagnosis not present

## 2022-01-25 DIAGNOSIS — Z1231 Encounter for screening mammogram for malignant neoplasm of breast: Secondary | ICD-10-CM | POA: Diagnosis not present

## 2022-01-25 DIAGNOSIS — Z681 Body mass index (BMI) 19 or less, adult: Secondary | ICD-10-CM | POA: Diagnosis not present

## 2022-01-25 DIAGNOSIS — Z01419 Encounter for gynecological examination (general) (routine) without abnormal findings: Secondary | ICD-10-CM | POA: Diagnosis not present

## 2022-03-02 ENCOUNTER — Ambulatory Visit: Payer: 59 | Attending: Neurological Surgery

## 2022-03-02 ENCOUNTER — Other Ambulatory Visit: Payer: Self-pay

## 2022-03-02 DIAGNOSIS — M6281 Muscle weakness (generalized): Secondary | ICD-10-CM | POA: Diagnosis not present

## 2022-03-02 DIAGNOSIS — M5459 Other low back pain: Secondary | ICD-10-CM | POA: Diagnosis not present

## 2022-03-02 NOTE — Therapy (Signed)
?OUTPATIENT PHYSICAL THERAPY THORACOLUMBAR EVALUATION ? ? ?Patient Name: Joanne Booth ?MRN: [id number] ?DOB:1965/12/22, 56 y.o., female ?Today's Date: 03/02/2022 ? ? PT End of Session - 03/02/22 1700   ? ? Visit Number 1   ? Number of Visits 9   ? Date for PT Re-Evaluation 05/04/22   ? Authorization Type MCE   ? Authorization Time Period FOTO v6, v10   ? PT Start Time 1620   ? PT Stop Time 1700   ? PT Time Calculation (min) 40 min   ? Activity Tolerance Patient tolerated treatment well   ? Behavior During Therapy [facility] for tasks assessed/performed   ? ?  ?  ? ?  ? ? ?Past Medical History:  ?Diagnosis Date  ? Basal cell carcinoma   ? ?Past Surgical History:  ?Procedure Laterality Date  ? basal cell skin cancer removals    ? ?Patient Active Problem List  ? Diagnosis Date Noted  ? 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 Bilateral hip pain 08/25/2015  ? ? ?PCP: Louretta Shorten, MD ? ?REFERRING PROVIDER: Dawley, Theodoro Doing, DO ? ?REFERRING DIAG: M54.59 (ICD-10-CM) - Other low back pain ? ?THERAPY DIAG:  ?Other low back pain - Plan: PT plan of care cert/re-cert ? ?Muscle weakness (generalized) - Plan: PT plan of care cert/re-cert ? ?ONSET DATE: 2 years ago ? ?SUBJECTIVE:                                                                                                                                                                                          ? ?SUBJECTIVE STATEMENT: ?Pt reports primary c/o chronic Lt LBP with associated muscle spasms lasting about two years. She received PT for this problem about two years ago with good results. She reports since that time, her symptoms have gotten worse.  From September to December, she received another round of PT without positive results. She describes the pain as a deep ache with occasional electric shock pain to her Lt low back. She denies any N/T or radicular pain related to this problem. She denies any hx of back injuries and adds that recent imaging is suggestive of lumbar  dextroscoliosis with retrolisthesis. Pt provided written report of imaging on her phone.  She also reports that the pain can wrap around her Lt abdomen, leading to nausea, as well as BIL Rt>LT anterior hip pain lasting years. Pt reports she has been seen in chiropractic services with no pain relief in the past, although mechanical lumbar traction was helpful for pain. Current pain: 2-4/10. Worst pain: 7-8/10. Aggravating factors include bending forward, trunk rotation. Easing factors include positional changes, stretches,  Voltaren, massage gun. Pt denies any unexplained weight change, saddle anesthesia, changes in bowel/ bladder function, or vomiting.  ? ?PERTINENT HISTORY:  ?Lumbar dextroscoliosis ? ?PAIN:  ?Are you having pain? Yes: NPRS scale: 2-4/10 ?Pain location: Lt lumbar ?Pain description: dull ache, occasional electric shock ?Aggravating factors: bending forward, trunk rotation ?Relieving factors: positional changes, stretches, Voltaren, massage gun ? ? ?PRECAUTIONS: None ? ?WEIGHT BEARING RESTRICTIONS No ? ?FALLS:  ?Has patient fallen in last 6 months? No ? ?LIVING ENVIRONMENT: ?Lives with: lives with their family ?Lives in: House/apartment ?Stairs: Yes: External: 2 steps; on right going up and on left going up ?Has following equipment at home: None ? ?OCCUPATION: Nurse ? ?PLOF: Independent ? ?PATIENT GOALS Return to weight classes, ADLs, work duties with less pain ? ? ?OBJECTIVE:  ? ?DIAGNOSTIC FINDINGS:  ?05/26/2021: XR Lumbar Spine: IMPRESSION: Multiple level degenerative changes of lumbar spine. Moderate rotary dextroscoliosis.  ? ?PATIENT SURVEYS:  ?FOTO 56%, predicted 64% in 11 visits ? ?SCREENING FOR RED FLAGS: ?Bowel or bladder incontinence: No ?Cauda equina syndrome: No ? ?COGNITION: ? Overall cognitive status: Within functional limits for tasks assessed   ?  ?SENSATION: ?Not tested ? ?MUSCLE LENGTH: ?Thomas test: Mild limitation on Rt ? ?POSTURE:  ?Noted lumbar dextroscoliosis noted, decreased  lumbar lordosis ? ?PALPATION: ?TTP with palpable trigger points along Lt thoracolumbar paraspinals ? ?PASSIVE ACCESSORIES: ?Hypomobile CPAs T4-L5 ? ?LUMBAR ROM:  ? ?Active  AROM  ?03/02/2022  ?Flexion WNL, p!!  ?Extension WNL, p!  ?Right lateral flexion WNL, p!  ?Left lateral flexion WNL, p!  ?Right rotation WNL  ?Left rotation WNL, minor p!  ? (Blank rows = not tested) ? ? ?LE MMT: ? ?MMT Right ?03/02/2022 Left ?03/02/2022  ?Hip flexion 4/5 4/5  ?Hip extension 4/5 4/5  ?Hip abduction 4+/5 4/5  ?Knee flexion 5/5 5/5  ?Knee extension 5/5 5/5  ?Ankle dorsiflexion 5/5 5/5  ?Ankle plantarflexion 5/5 5/5  ? (Blank rows = not tested) ? ?SPECIAL TESTS:  ?PLE: (+) ?PIT: (-) ?Rib hump test: ?FABER: (-) ?FADDIR: (+) on Rt for anterior hip pain ? ?FUNCTIONAL TESTS:  ?Squat: WNL ?5xSTS: 9 seconds ?Plank: 70 seconds, terminated by PT ? ? ? ? ?TODAY'S TREATMENT  ?Demonstrated and issued HEP ? ? ?PATIENT EDUCATION:  ?Education details: Pt educated on probable underlying pathophysiology behind her pain presentation, POC, prognosis, FOTO, and HEP ?Person educated: Patient ?Education method: Explanation, Demonstration, and Handouts ?Education comprehension: verbalized understanding and returned demonstration ? ? ?HOME EXERCISE PROGRAM: ?Access Code: [email or web address] ?URL: [email or web address] ?Date: 03/02/2022 ?Prepared by: Vanessa [place] ? ?Exercises ?- Standard Plank  - 1 x daily - 7 x weekly - 3 sets - to failure hold ?- Modified Side Plank with Hip Abduction  - 1 x daily - 7 x weekly - 2 sets - 10 reps ?- Sidelying Open Book Thoracic Lumbar Rotation and Extension  - 1 x daily - 7 x weekly - 2 sets - 10 reps ?- Marching Bridge  - 1 x daily - 7 x weekly - 3 sets - 20 reps ? ?ASSESSMENT: ? ?CLINICAL IMPRESSION: ?Patient is a 56 y.o. F who was seen today for physical therapy evaluation and treatment for chronic LBP. Upon assessment, primary impairments include painful lumbar flexion, extension, and BIL side bending with most  pain with forward flexion, weak BIL hip MMT, TTP to Lt thoracolumbar paraspinals, hypomobile thoracolumbar passive accessory mobility, and mild tightness in Rt hip flexors. Ruling up mechanical LBP likely related to known lumbar dextroscoliosis. Cannot rule out  Rt hip involvement due to positive FADDIR testing, although further hip assessment is warranted. Pt will benefit from skilled PT to address her primary impairments and return to her prior level of function with less limitation.  ? ? ?OBJECTIVE IMPAIRMENTS decreased mobility, decreased ROM, decreased strength, hypomobility, impaired flexibility, improper body mechanics, postural dysfunction, and pain.  ? ?ACTIVITY LIMITATIONS cleaning, community activity, driving, occupation, laundry, yard work, and shopping.  ? ?PERSONAL FACTORS Time since onset of injury/illness/exacerbation are also affecting patient's functional outcome.  ? ? ?REHAB POTENTIAL: Good ? ?CLINICAL DECISION MAKING: Stable/uncomplicated ? ?EVALUATION COMPLEXITY: Low ? ? ?GOALS: ?Goals reviewed with patient? Yes ? ?SHORT TERM GOALS: Target date: 03/30/2022     ? ?Pt will report understanding and adherence to her HEP in order to promote independence in the management of her primary impairments. ?Baseline: HEP provided at eval ?Goal status: INITIAL ? ? ?LONG TERM GOALS: Target date: 04/27/2022  ? ?Pt will achieve a FOTO score of 64% in order to demonstrate improved functional ability as it relates to her lumbar impairments. ?Baseline: 56% ?Goal status: INITIAL ? ?2.  Pt will achieve full BIL trunk side flexion AROM with 0-2/10 pain in order to bend over to pick up groceries with less limitation. ?Baseline: 4-5/10 pain ?Goal status: INITIAL ? ?3.  Pt will achieve BIL hip strength of 5/5 in order to progress her independent LE strengthening regimen. ?Baseline: See MMT chart ?Goal status: INITIAL ? ?4.  Pt will achieve full trunk flexion AROM with 0-2/10 pain in order to get dressed with less  limitation. ?Baseline: 6/10 pain ?Goal status: INITIAL ? ? ? ?PLAN: ?PT FREQUENCY: 1x/week ? ?PT DURATION: 8 weeks ? ?PLANNED INTERVENTIONS: Therapeutic exercises, Therapeutic activity, Neuromuscular re-education,

## 2022-03-17 ENCOUNTER — Encounter: Payer: Self-pay | Admitting: Physical Therapy

## 2022-03-17 ENCOUNTER — Ambulatory Visit: Payer: 59 | Attending: Neurological Surgery | Admitting: Physical Therapy

## 2022-03-17 DIAGNOSIS — M62830 Muscle spasm of back: Secondary | ICD-10-CM | POA: Insufficient documentation

## 2022-03-17 DIAGNOSIS — M545 Low back pain, unspecified: Secondary | ICD-10-CM | POA: Diagnosis not present

## 2022-03-17 DIAGNOSIS — M5459 Other low back pain: Secondary | ICD-10-CM | POA: Insufficient documentation

## 2022-03-17 DIAGNOSIS — M6281 Muscle weakness (generalized): Secondary | ICD-10-CM | POA: Insufficient documentation

## 2022-03-17 NOTE — Therapy (Signed)
OUTPATIENT PHYSICAL THERAPY TREATMENT NOTE   Patient Name: Joanne Booth MRN: [id number] DOB:August 19, 1966, 56 y.o., female Today's Date: 03/17/2022  PCP: Louretta Shorten, MD REFERRING PROVIDER: Dawley, Theodoro Doing, DO   PT End of Session - 03/17/22 1702     Visit Number 2    Number of Visits 9    Date for PT Re-Evaluation 05/04/22    Authorization Type MCE    Authorization Time Period FOTO v6, v10    PT Start Time 1701    PT Stop Time 1742    PT Time Calculation (min) 41 min    Activity Tolerance Patient tolerated treatment well    Behavior During Therapy WFL for tasks assessed/performed             Past Medical History:  Diagnosis Date   Basal cell carcinoma    Past Surgical History:  Procedure Laterality Date   basal cell skin cancer removals     Patient Active Problem List   Diagnosis Date Noted   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  PAIN:  Are you having pain? 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 Moderate rotary dextroscoliosis.    PATIENT SURVEYS:  FOTO 56%, predicted 64% in 11 visits   SCREENING FOR RED FLAGS: Bowel or bladder incontinence: No Cauda equina syndrome: No   COGNITION:           Overall cognitive  status: Within functional limits for tasks assessed                          SENSATION: Not tested   MUSCLE LENGTH: Marcello Moores test: Mild limitation on Rt   POSTURE:  Noted lumbar dextroscoliosis noted, decreased lumbar lordosis   PALPATION: TTP with palpable trigger points along Lt thoracolumbar paraspinals   PASSIVE ACCESSORIES: Hypomobile CPAs T4-L5   LUMBAR ROM:    Active  AROM  03/02/2022  Flexion WNL, p!!  Extension WNL, p!  Right lateral flexion WNL, p!  Left lateral flexion WNL, p!  Right rotation WNL  Left rotation WNL, minor p!   (Blank rows = not tested)     LE MMT:   MMT Right 03/02/2022 Left 03/02/2022  Hip flexion 4/5 4/5  Hip extension 4/5 4/5  Hip abduction 4+/5 4/5  Knee flexion 5/5 5/5  Knee extension 5/5 5/5  Ankle dorsiflexion 5/5 5/5  Ankle plantarflexion 5/5 5/5   (Blank rows = not tested)   SPECIAL TESTS:  PLE: (+) PIT: (-) Rib hump test: FABER: (-) FADDIR: (+) on Rt for anterior hip pain   FUNCTIONAL TESTS:  Squat: WNL 5xSTS: 9 seconds Plank: 70 seconds, terminated by PT   TODAY'S TREATMENT  Demonstrated and issued HEP     PATIENT EDUCATION:  Education details: Pt educated on probable underlying pathophysiology behind her pain  presentation, POC, prognosis, FOTO, and HEP Person educated: Patient Education method: Explanation, Demonstration, and Handouts Education comprehension: verbalized understanding and returned demonstration     HOME EXERCISE PROGRAM: Access Code: [email or web address] URL: [email or web address] Date: 03/02/2022 Prepared by: Vanessa [place]   Exercises - Standard Plank  - 1 x daily - 7 x weekly - 3 sets - to failure hold - Modified Side Plank with Hip Abduction  - 1 x daily - 7 x weekly - 2 sets - 10 reps - Sidelying Open Book Thoracic Lumbar Rotation and Extension  - 1 x daily - 7 x weekly - 2 sets - 10 reps - Marching Bridge  - 1 x daily - 7 x weekly - 3 sets - 20 reps   TREATMENT  03/17/2022:  Therapeutic Exercise: - nu-step L5 7mwhile taking subjective and planning session with patient - LTR  - pelvic tilt 5'' hold x10  - w/ belt and foam roller 3x10  - Bridge with march - 2x10 - side plank with hip abd (L side weaker) x10 - Bird dog - 6x - 10'' hold (D/C d/t pain) - Lumbar ext with row bar - seated - 30#  Manual Therapy L q/l rotational stretch with L LE hanging over table   ASSESSMENT:   CLINICAL IMPRESSION: PTyiadid well with therapy today.  We concentrated on foundational core strengthening to good effect.  She did have some increase in sxs with bird dog and lumbar ext, but this dissipated with rest.  She did respond well to manual therapy with a significant reduction in pain following.     OBJECTIVE IMPAIRMENTS decreased mobility, decreased ROM, decreased strength, hypomobility, impaired flexibility, improper body mechanics, postural dysfunction, and pain.    ACTIVITY LIMITATIONS cleaning, community activity, driving, occupation, lMedical sales representative yard work, and shopping.    PERSONAL FACTORS Time since onset of injury/illness/exacerbation are also affecting patient's functional outcome.      REHAB POTENTIAL: Good   CLINICAL DECISION MAKING: Stable/uncomplicated   EVALUATION COMPLEXITY: Low     GOALS: Goals reviewed with patient? Yes   SHORT TERM GOALS: Target date: 03/30/2022       Pt will report understanding and adherence to her HEP in order to promote independence in the management of her primary impairments. Baseline: HEP provided at eval Goal status: INITIAL     LONG TERM GOALS: Target date: 04/27/2022    Pt will achieve a FOTO score of 64% in order to demonstrate improved functional ability as it relates to her lumbar impairments. Baseline: 56% Goal status: INITIAL   2.  Pt will achieve full BIL trunk side flexion AROM with 0-2/10 pain in order to bend over to pick up groceries with less limitation. Baseline: 4-5/10 pain Goal status:  INITIAL   3.  Pt will achieve BIL hip strength of 5/5 in order to progress her independent LE strengthening regimen. Baseline: See MMT chart Goal status: INITIAL   4.  Pt will achieve full trunk flexion AROM with 0-2/10 pain in order to get dressed with less limitation. Baseline: 6/10 pain Goal status: INITIAL       PLAN: PT FREQUENCY: 1x/week   PT DURATION: 8 weeks   PLANNED INTERVENTIONS: Therapeutic exercises, Therapeutic activity, Neuromuscular re-education, Balance training, Gait training, Patient/Family education, Joint manipulation, Joint mobilization, Stair training, DME instructions, Aquatic Therapy, Dry Needling, Electrical stimulation, Spinal manipulation, Spinal mobilization, Cryotherapy, Moist heat, Taping, Traction, Biofeedback, Ionotophoresis '4mg'$ /ml Dexamethasone, Manual therapy, and Re-evaluation.   PLAN FOR NEXT SESSION: Further hip assessment, progress  core/ hip strengthening, lumbar mobility, consider manual techniques/ OMT/ TPDN for pain reduction   Kevan Ny Valarie Farace PT 03/17/2022, 6:06 PM

## 2022-03-22 ENCOUNTER — Ambulatory Visit: Payer: 59

## 2022-03-22 DIAGNOSIS — M5459 Other low back pain: Secondary | ICD-10-CM | POA: Diagnosis not present

## 2022-03-22 DIAGNOSIS — M62830 Muscle spasm of back: Secondary | ICD-10-CM | POA: Diagnosis not present

## 2022-03-22 DIAGNOSIS — M6281 Muscle weakness (generalized): Secondary | ICD-10-CM

## 2022-03-22 DIAGNOSIS — M545 Low back pain, unspecified: Secondary | ICD-10-CM | POA: Diagnosis not present

## 2022-03-22 NOTE — Therapy (Signed)
OUTPATIENT PHYSICAL THERAPY TREATMENT NOTE   Patient Name: Joanne Booth MRN: [id number] DOB:10/21/1965, 56 y.o., female Today's Date: 03/22/2022  PCP: Louretta Shorten, MD REFERRING PROVIDER: Dawley, Theodoro Doing, DO   PT End of Session - 03/22/22 1622     Visit Number 3    Number of Visits 9    Date for PT Re-Evaluation 05/04/22    Authorization Type MCE    Authorization Time Period FOTO v6, v10    PT Start Time 1621    PT Stop Time 1659    PT Time Calculation (min) 38 min    Activity Tolerance Patient tolerated treatment well    Behavior During Therapy WFL for tasks assessed/performed              Past Medical History:  Diagnosis Date   Basal cell carcinoma    Past Surgical History:  Procedure Laterality Date   basal cell skin cancer removals     Patient Active Problem List   Diagnosis Date Noted   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  PAIN:  Are you having pain? 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 Moderate rotary dextroscoliosis.    PATIENT SURVEYS:  FOTO 56%, predicted 64% in 11 visits   SCREENING FOR RED FLAGS: Bowel or bladder incontinence: No Cauda equina syndrome: No   COGNITION:           Overall cognitive status: Within functional limits for tasks assessed                          SENSATION: Not  tested   MUSCLE LENGTH: Marcello Moores test: Mild limitation on Rt   POSTURE:  Noted lumbar dextroscoliosis noted, decreased lumbar lordosis   PALPATION: TTP with palpable trigger points along Lt thoracolumbar paraspinals   PASSIVE ACCESSORIES: Hypomobile CPAs T4-L5   LUMBAR ROM:    Active  AROM  03/02/2022  Flexion WNL, p!!  Extension WNL, p!  Right lateral flexion WNL, p!  Left lateral flexion WNL, p!  Right rotation WNL  Left rotation WNL, minor p!   (Blank rows = not tested)     LE MMT:   MMT Right 03/02/2022 Left 03/02/2022  Hip flexion 4/5 4/5  Hip extension 4/5 4/5  Hip abduction 4+/5 4/5  Knee flexion 5/5 5/5  Knee extension 5/5 5/5  Ankle dorsiflexion 5/5 5/5  Ankle plantarflexion 5/5 5/5   (Blank rows = not tested)   SPECIAL TESTS:  PLE: (+) PIT: (-) Rib hump test: FABER: (-) FADDIR: (+) on Rt for anterior hip pain   FUNCTIONAL TESTS:  Squat: WNL 5xSTS: 9 seconds Plank: 70 seconds, terminated by PT   TODAY'S TREATMENT  [facility] Adult PT Treatment:  DATE: 03/22/2022 Therapeutic Exercise: Bridge isometric with marching 3x20 DKTC 2x30sec Side knee plank with clams 2x10 BIL Standing Pallof press with 10# cable 2x10 with 5-sec hold BIL Manual Therapy: Sidelying lumbar roll grade V manipulation x1 BIL with cavitation Prone effleurage/ tapotement to BIL lumbar paraspinals/ QL Neuromuscular re-ed: N/A Therapeutic Activity: N/A Modalities: N/A Self Care: N/A    TREATMENT 03/17/2022:  Therapeutic Exercise: - nu-step L5 59mwhile taking subjective and planning session with patient - LTR  - pelvic tilt 5'' hold x10  - w/ belt and foam roller 3x10  - Bridge with march - 2x10 - side plank with hip abd (L side weaker) x10 - Bird dog - 6x - 10'' hold (D/C d/t pain) - Lumbar ext with row bar - seated - 30#  Manual Therapy L q/l rotational stretch with L LE hanging over table     PATIENT EDUCATION:  Education  details: Pt educated on probable underlying pathophysiology behind her pain presentation, POC, prognosis, FOTO, and HEP Person educated: Patient Education method: Explanation, Demonstration, and Handouts Education comprehension: verbalized understanding and returned demonstration     HOME EXERCISE PROGRAM: Access Code: [email or web address]: [email or web address] Date: 03/02/2022 Prepared by: TVanessa [place]  Exercises - Standard Plank  - 1 x daily - 7 x weekly - 3 sets - to failure hold - Modified Side Plank with Hip Abduction  - 1 x daily - 7 x weekly - 2 sets - 10 reps - Sidelying Open Book Thoracic Lumbar Rotation and Extension  - 1 x daily - 7 x weekly - 2 sets - 10 reps - Marching Bridge  - 1 x daily - 7 x weekly - 3 sets - 20 reps      ASSESSMENT:   CLINICAL IMPRESSION: Pt responded excellently to progress core/ hip strengthening exercises today. She reports no pain and demonstrates good form with selected interventions, only noted moderate fatigue at the end of the session. She will continue to benefit from skilled PT to address her primary impairments and return to her prior level of function with less limitation.     OBJECTIVE IMPAIRMENTS decreased mobility, decreased ROM, decreased strength, hypomobility, impaired flexibility, improper body mechanics, postural dysfunction, and pain.    ACTIVITY LIMITATIONS cleaning, community activity, driving, occupation, lMedical sales representative yard work, and shopping.    PERSONAL FACTORS Time since onset of injury/illness/exacerbation are also affecting patient's functional outcome.        GOALS: Goals reviewed with patient? Yes   SHORT TERM GOALS: Target date: 03/30/2022       Pt will report understanding and adherence to her HEP in order to promote independence in the management of her primary impairments. Baseline: HEP provided at eval Goal status: INITIAL     LONG TERM GOALS: Target date: 04/27/2022    Pt will achieve a FOTO  score of 64% in order to demonstrate improved functional ability as it relates to her lumbar impairments. Baseline: 56% Goal status: INITIAL   2.  Pt will achieve full BIL trunk side flexion AROM with 0-2/10 pain in order to bend over to pick up groceries with less limitation. Baseline: 4-5/10 pain Goal status: INITIAL   3.  Pt will achieve BIL hip strength of 5/5 in order to progress her independent LE strengthening regimen. Baseline: See MMT chart Goal status: INITIAL   4.  Pt will achieve full trunk flexion AROM with 0-2/10 pain in order to get dressed with less limitation. Baseline: 6/10 pain Goal status: INITIAL  PLAN: PT FREQUENCY: 1x/week   PT DURATION: 8 weeks   PLANNED INTERVENTIONS: Therapeutic exercises, Therapeutic activity, Neuromuscular re-education, Balance training, Gait training, Patient/Family education, Joint manipulation, Joint mobilization, Stair training, DME instructions, Aquatic Therapy, Dry Needling, Electrical stimulation, Spinal manipulation, Spinal mobilization, Cryotherapy, Moist heat, Taping, Traction, Biofeedback, Ionotophoresis '4mg'$ /ml Dexamethasone, Manual therapy, and Re-evaluation.   PLAN FOR NEXT SESSION: Further hip assessment, progress core/ hip strengthening, lumbar mobility, consider manual techniques/ OMT/ TPDN for pain reduction   Carolann Littler Chinwe Lope PT 03/22/2022, 4:59 PM

## 2022-03-23 DIAGNOSIS — H04123 Dry eye syndrome of bilateral lacrimal glands: Secondary | ICD-10-CM | POA: Diagnosis not present

## 2022-03-23 DIAGNOSIS — H524 Presbyopia: Secondary | ICD-10-CM | POA: Diagnosis not present

## 2022-03-23 DIAGNOSIS — G43B0 Ophthalmoplegic migraine, not intractable: Secondary | ICD-10-CM | POA: Diagnosis not present

## 2022-03-23 DIAGNOSIS — H25813 Combined forms of age-related cataract, bilateral: Secondary | ICD-10-CM | POA: Diagnosis not present

## 2022-03-30 ENCOUNTER — Ambulatory Visit: Payer: 59

## 2022-04-05 ENCOUNTER — Ambulatory Visit: Payer: 59

## 2022-04-05 DIAGNOSIS — M62830 Muscle spasm of back: Secondary | ICD-10-CM | POA: Diagnosis not present

## 2022-04-05 DIAGNOSIS — M6281 Muscle weakness (generalized): Secondary | ICD-10-CM | POA: Diagnosis not present

## 2022-04-05 DIAGNOSIS — M545 Low back pain, unspecified: Secondary | ICD-10-CM | POA: Diagnosis not present

## 2022-04-05 DIAGNOSIS — M5459 Other low back pain: Secondary | ICD-10-CM

## 2022-04-05 NOTE — Therapy (Signed)
OUTPATIENT PHYSICAL THERAPY TREATMENT NOTE   Patient Name: Joanne Booth MRN: [id number] DOB:10/02/1966, 56 y.o., female Today's Date: 04/05/2022  PCP: Louretta Shorten, MD REFERRING PROVIDER: Dawley, Theodoro Doing, DO   PT End of Session - 04/05/22 1659     Visit Number 4    Number of Visits 9    Date for PT Re-Evaluation 05/04/22    Authorization Type MCE    Authorization Time Period FOTO v6, v10    PT Start Time 1618    PT Stop Time 1700    PT Time Calculation (min) 42 min    Activity Tolerance Patient tolerated treatment well    Behavior During Therapy WFL for tasks assessed/performed               Past Medical History:  Diagnosis Date   Basal cell carcinoma    Past Surgical History:  Procedure Laterality Date   basal cell skin cancer removals     Patient Active Problem List   Diagnosis Date Noted   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 She reports continued adherence to her HEP.  PAIN:  Are you having pain? Yes: NPRS scale: 3/10 Pain location: Lt lumbar Pain description: dull ache, occasional electric shock Aggravating factors: bending forward, trunk rotation Relieving factors: positional changes, stretches, Voltaren, massage gun  OBJECTIVE:  DIAGNOSTIC FINDINGS:  05/26/2021: XR Lumbar Spine: IMPRESSION: Multiple level degenerative changes of lumbar spine. Moderate rotary dextroscoliosis.    PATIENT SURVEYS:  FOTO 56%, predicted 64% in 11 visits   SCREENING FOR RED FLAGS: Bowel or bladder incontinence: No Cauda equina syndrome: No    COGNITION:           Overall cognitive status: Within functional limits for tasks assessed                          SENSATION: Not tested   MUSCLE LENGTH: Marcello Moores test: Mild limitation on Rt   POSTURE:  Noted lumbar dextroscoliosis noted, decreased lumbar lordosis   PALPATION: TTP with palpable trigger points along Lt thoracolumbar paraspinals   PASSIVE ACCESSORIES: Hypomobile CPAs T4-L5   LUMBAR ROM:    Active  AROM  03/02/2022  Flexion WNL, p!!  Extension WNL, p!  Right lateral flexion WNL, p!  Left lateral flexion WNL, p!  Right rotation WNL  Left rotation WNL, minor p!   (Blank rows = not tested)     LE MMT:   MMT Right 03/02/2022 Left 03/02/2022  Hip flexion 4/5 4/5  Hip extension 4/5 4/5  Hip abduction 4+/5 4/5  Knee flexion 5/5 5/5  Knee extension 5/5 5/5  Ankle dorsiflexion 5/5 5/5  Ankle plantarflexion 5/5 5/5   (Blank rows = not tested)   SPECIAL TESTS:  PLE: (+) PIT: (-) Rib hump test: FABER: (-) FADDIR: (+) on Rt for anterior hip pain   FUNCTIONAL TESTS:  Squat: WNL 5xSTS: 9 seconds Plank: 70 seconds, terminated by PT   TODAY'S TREATMENT   [facility] Adult PT Treatment:  DATE: 04/05/2022 Therapeutic Exercise: Prone swimmers 3x20 with 2# ankle weights Straight-arm reverse crunches 3x8 Sidelying trunk rotation with GTB UE pull into side plank x10 BIL DKTC 2x30sec 75# hex bar [place] dead lift 3x10 Manual Therapy: N/A Neuromuscular re-ed: N/A Therapeutic Activity: In-depth pt education regarding pain neuroscience, central sensitization, broken alarm system, and prognosis Modalities: N/A Self Care: N/A   [facility] Adult PT Treatment:                                                DATE: 03/22/2022 Therapeutic Exercise: Bridge isometric with marching 3x20 DKTC 2x30sec Side knee plank with clams 2x10 BIL Standing Pallof press with 10# cable 2x10 with 5-sec hold BIL Manual Therapy: Sidelying lumbar  roll grade V manipulation x1 BIL with cavitation Prone effleurage/ tapotement to BIL lumbar paraspinals/ QL Neuromuscular re-ed: N/A Therapeutic Activity: N/A Modalities: N/A Self Care: N/A    TREATMENT 03/17/2022:  Therapeutic Exercise: - nu-step L5 50mwhile taking subjective and planning session with patient - LTR  - pelvic tilt 5'' hold x10  - w/ belt and foam roller 3x10  - Bridge with march - 2x10 - side plank with hip abd (L side weaker) x10 - Bird dog - 6x - 10'' hold (D/C d/t pain) - Lumbar ext with row bar - seated - 30#  Manual Therapy L q/l rotational stretch with L LE hanging over table     PATIENT EDUCATION:  Education details: Pt educated on probable underlying pathophysiology behind her pain presentation, POC, prognosis, FOTO, and HEP Person educated: Patient Education method: Explanation, Demonstration, and Handouts Education comprehension: verbalized understanding and returned demonstration     HOME EXERCISE PROGRAM: Access Code: [email or web address]: [email or web address] Date: 03/02/2022 Prepared by: TVanessa [place]  Exercises - Standard Plank  - 1 x daily - 7 x weekly - 3 sets - to failure hold - Modified Side Plank with Hip Abduction  - 1 x daily - 7 x weekly - 2 sets - 10 reps - Sidelying Open Book Thoracic Lumbar Rotation and Extension  - 1 x daily - 7 x weekly - 2 sets - 10 reps - Marching Bridge  - 1 x daily - 7 x weekly - 3 sets - 20 reps      ASSESSMENT:   CLINICAL IMPRESSION: Pt responded well to all interventions today, demonstrating good form and no increase in pain with progressed core and hip strengthening. Due to pt's questions regarding chronic pain and prognosis, PT provided in-depth education about pain neuroscience and central sensitization with many chronic pain conditions, utilizing the broken alarm metaphor. She reports understanding and pt buy-in. Pt will continue to benefit from skilled PT to address her primary  impairments and return to her prior level of function with less limitation.     OBJECTIVE IMPAIRMENTS decreased mobility, decreased ROM, decreased strength, hypomobility, impaired flexibility, improper body mechanics, postural dysfunction, and pain.    ACTIVITY LIMITATIONS cleaning, community activity, driving, occupation, lMedical sales representative yard work, and shopping.    PERSONAL FACTORS Time since onset of injury/illness/exacerbation are also affecting patient's functional outcome.        GOALS: Goals reviewed with patient? Yes   SHORT TERM GOALS: Target date: 03/30/2022       Pt will report understanding and adherence to her HEP in order to promote independence in the management of her primary impairments.  Baseline: HEP provided at eval Goal status: INITIAL     LONG TERM GOALS: Target date: 04/27/2022    Pt will achieve a FOTO score of 64% in order to demonstrate improved functional ability as it relates to her lumbar impairments. Baseline: 56% Goal status: INITIAL   2.  Pt will achieve full BIL trunk side flexion AROM with 0-2/10 pain in order to bend over to pick up groceries with less limitation. Baseline: 4-5/10 pain Goal status: INITIAL   3.  Pt will achieve BIL hip strength of 5/5 in order to progress her independent LE strengthening regimen. Baseline: See MMT chart Goal status: INITIAL   4.  Pt will achieve full trunk flexion AROM with 0-2/10 pain in order to get dressed with less limitation. Baseline: 6/10 pain Goal status: INITIAL       PLAN: PT FREQUENCY: 1x/week   PT DURATION: 8 weeks   PLANNED INTERVENTIONS: Therapeutic exercises, Therapeutic activity, Neuromuscular re-education, Balance training, Gait training, Patient/Family education, Joint manipulation, Joint mobilization, Stair training, DME instructions, Aquatic Therapy, Dry Needling, Electrical stimulation, Spinal manipulation, Spinal mobilization, Cryotherapy, Moist heat, Taping, Traction, Biofeedback,  Ionotophoresis '4mg'$ /ml Dexamethasone, Manual therapy, and Re-evaluation.   PLAN FOR NEXT SESSION: Further hip assessment, progress core/ hip strengthening, lumbar mobility, consider manual techniques/ OMT/ TPDN for pain reduction   Vanessa Celeste, PT, DPT 04/05/22 5:00 PM

## 2022-04-14 ENCOUNTER — Ambulatory Visit: Payer: 59

## 2022-04-14 DIAGNOSIS — M5459 Other low back pain: Secondary | ICD-10-CM

## 2022-04-14 DIAGNOSIS — M545 Low back pain, unspecified: Secondary | ICD-10-CM | POA: Diagnosis not present

## 2022-04-14 DIAGNOSIS — M6281 Muscle weakness (generalized): Secondary | ICD-10-CM

## 2022-04-14 DIAGNOSIS — M62830 Muscle spasm of back: Secondary | ICD-10-CM | POA: Diagnosis not present

## 2022-04-14 NOTE — Therapy (Signed)
OUTPATIENT PHYSICAL THERAPY TREATMENT NOTE   Patient Name: Joanne Booth MRN: [id number] DOB:[birth year], 56 y.o., female Today's Date: 04/14/2022  PCP: Louretta Shorten, MD REFERRING PROVIDER: Dawley, Theodoro Doing, DO   PT End of Session - 04/14/22 1309     Visit Number 5    Number of Visits 9    Date for PT Re-Evaluation 05/04/22    Authorization Type MCE    Authorization Time Period FOTO v6, v10    PT Start Time 1309   Pt arrived 8 minutes late to her appointment.   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  PAIN:  Are you having pain? 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 Moderate rotary dextroscoliosis.    PATIENT SURVEYS:  FOTO 56%, predicted 64% in 11 visits   SCREENING FOR RED FLAGS: Bowel or bladder incontinence: No Cauda equina syndrome: No   COGNITION:           Overall cognitive status: Within functional limits  for tasks assessed                          SENSATION: Not tested   MUSCLE LENGTH: Marcello Moores test: Mild limitation on Rt   POSTURE:  Noted lumbar dextroscoliosis noted, decreased lumbar lordosis   PALPATION: TTP with palpable trigger points along Lt thoracolumbar paraspinals   PASSIVE ACCESSORIES: Hypomobile CPAs T4-L5   LUMBAR ROM:    Active  AROM  03/02/2022  Flexion WNL, p!!  Extension WNL, p!  Right lateral flexion WNL, p!  Left lateral flexion WNL, p!  Right rotation WNL  Left rotation WNL, minor p!   (Blank rows = not tested)     LE MMT:   MMT Right 03/02/2022 Left 03/02/2022  Hip flexion 4/5 4/5  Hip extension 4/5 4/5  Hip abduction 4+/5 4/5  Knee flexion 5/5 5/5  Knee extension 5/5 5/5  Ankle dorsiflexion 5/5 5/5  Ankle plantarflexion 5/5 5/5   (Blank rows = not tested)   SPECIAL TESTS:  PLE: (+) PIT: (-) Rib hump test: FABER: (-) FADDIR: (+) on Rt for anterior hip pain   FUNCTIONAL TESTS:  Squat: WNL 5xSTS: 9 seconds Plank: 70 seconds, terminated by PT   TODAY'S TREATMENT   [facility] Adult PT Treatment:  DATE: 04/14/2022 Therapeutic Exercise: [place] dog with knee tap progression 3x10 BIL Alternating forward lunge with overhead pull with 7# cables 3x10 Straight arm reverse crunches with 2kg ball hand-off 3x8 Side knee plank with hip abduction with GTB 2x10 BIL Manual Therapy: Prone effleurage to BIL lumbar paraspinals x8 minutes Sidelying lumbar roll grade V manipulation x1 BIL with cavitation Neuromuscular re-ed: N/A Therapeutic Activity: N/A Modalities: N/A Self Care: N/A   [facility] Adult PT Treatment:                                                DATE: 04/05/2022 Therapeutic Exercise: Prone swimmers 3x20 with 2# ankle weights Straight-arm reverse crunches 3x8 Sidelying trunk rotation with GTB UE pull into side plank x10 BIL DKTC 2x30sec 75# hex bar [place] dead lift 3x10 Thomas stretch x1 min  BIL Prone Superman with GTB hip abduction 2x10 with 3-sec hold DKTC x1 min Manual Therapy: N/A Neuromuscular re-ed: N/A Therapeutic Activity: In-depth pt education regarding pain neuroscience, central sensitization, broken alarm system, and prognosis Modalities: N/A Self Care: N/A   [facility] Adult PT Treatment:                                                DATE: 03/22/2022 Therapeutic Exercise: Bridge isometric with marching 3x20 DKTC 2x30sec Side knee plank with clams 2x10 BIL Standing Pallof press with 10# cable 2x10 with 5-sec hold BIL Manual Therapy: Sidelying lumbar roll grade V manipulation x1 BIL with cavitation Prone effleurage/ tapotement to BIL lumbar paraspinals/ QL Neuromuscular re-ed: N/A Therapeutic Activity: N/A Modalities: N/A Self Care: N/A        PATIENT EDUCATION:  Education details: Pt educated on probable underlying pathophysiology behind her pain presentation, POC, prognosis, FOTO, and HEP Person educated: Patient Education method: Explanation, Demonstration, and Handouts Education comprehension: verbalized understanding and returned demonstration     HOME EXERCISE PROGRAM: Access Code: [email or web address] URL: [email or web address] Date: 03/02/2022 Prepared by: Vanessa [place]   Exercises - Standard Plank  - 1 x daily - 7 x weekly - 3 sets - to failure hold - Modified Side Plank with Hip Abduction  - 1 x daily - 7 x weekly - 2 sets - 10 reps - Sidelying Open Book Thoracic Lumbar Rotation and Extension  - 1 x daily - 7 x weekly - 2 sets - 10 reps - Marching Bridge  - 1 x daily - 7 x weekly - 3 sets - 20 reps      ASSESSMENT:   CLINICAL IMPRESSION: Pt responded well to all progressed interventions today, demonstrating good form and no increase in pain with selected exercises. She reports a therapeutic response to manual techniques today. Pt will continue to benefit from skilled PT to address her primary impairments and return to her  prior level of function with less limitation.     OBJECTIVE IMPAIRMENTS decreased mobility, decreased ROM, decreased strength, hypomobility, impaired flexibility, improper body mechanics, postural dysfunction, and pain.    ACTIVITY LIMITATIONS cleaning, community activity, driving, occupation, Medical sales representative, yard work, and shopping.    PERSONAL FACTORS Time since onset of injury/illness/exacerbation are also affecting patient's functional outcome.        GOALS: Goals reviewed with patient? Yes   SHORT TERM GOALS:  Target date: 03/30/2022       Pt will report understanding and adherence to her HEP in order to promote independence in the management of her primary impairments. Baseline: HEP provided at eval Goal status: INITIAL     LONG TERM GOALS: Target date: 04/27/2022    Pt will achieve a FOTO score of 64% in order to demonstrate improved functional ability as it relates to her lumbar impairments. Baseline: 56% Goal status: INITIAL   2.  Pt will achieve full BIL trunk side flexion AROM with 0-2/10 pain in order to bend over to pick up groceries with less limitation. Baseline: 4-5/10 pain Goal status: INITIAL   3.  Pt will achieve BIL hip strength of 5/5 in order to progress her independent LE strengthening regimen. Baseline: See MMT chart Goal status: INITIAL   4.  Pt will achieve full trunk flexion AROM with 0-2/10 pain in order to get dressed with less limitation. Baseline: 6/10 pain Goal status: INITIAL       PLAN: PT FREQUENCY: 1x/week   PT DURATION: 8 weeks   PLANNED INTERVENTIONS: Therapeutic exercises, Therapeutic activity, Neuromuscular re-education, Balance training, Gait training, Patient/Family education, Joint manipulation, Joint mobilization, Stair training, DME instructions, Aquatic Therapy, Dry Needling, Electrical stimulation, Spinal manipulation, Spinal mobilization, Cryotherapy, Moist heat, Taping, Traction, Biofeedback, Ionotophoresis '4mg'$ /ml Dexamethasone,  Manual therapy, and Re-evaluation.   PLAN FOR NEXT SESSION: Further hip assessment, progress core/ hip strengthening, lumbar mobility, consider manual techniques/ OMT/ TPDN for pain reduction   Vanessa [place], PT, DPT 04/14/22 1:49 PM

## 2022-04-28 ENCOUNTER — Ambulatory Visit: Payer: 59 | Attending: Neurological Surgery

## 2022-04-28 DIAGNOSIS — M6281 Muscle weakness (generalized): Secondary | ICD-10-CM | POA: Insufficient documentation

## 2022-04-28 DIAGNOSIS — M5459 Other low back pain: Secondary | ICD-10-CM | POA: Diagnosis not present

## 2022-04-28 NOTE — Therapy (Signed)
OUTPATIENT PHYSICAL THERAPY TREATMENT NOTE/ RE-CERTIFICATION   Patient Name: Joanne Booth MRN: [id number] DOB:02-06-1966, 56 y.o., female Today's Date: 04/28/2022  PCP: Louretta Shorten, MD REFERRING PROVIDER: Dawley, Theodoro Doing, DO   PT End of Session - 04/28/22 1616     Visit Number 6    Number of Visits 12    Date for PT Re-Evaluation 06/16/22    Authorization Type MCE    Authorization Time Period FOTO v6, v10    PT Start Time 1616    PT Stop Time 1658    PT Time Calculation (min) 42 min    Activity Tolerance Patient tolerated treatment well    Behavior During Therapy WFL for tasks assessed/performed                 Past Medical History:  Diagnosis Date   Basal cell carcinoma    Past Surgical History:  Procedure Laterality Date   basal cell skin cancer removals     Patient Active Problem List   Diagnosis Date Noted   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 She reports adherence to her HEP.  PAIN:  Are you having pain? 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 Moderate rotary dextroscoliosis.    PATIENT SURVEYS:  FOTO 56%, predicted 64% in 11 visits 04/28/2022: 57%   SCREENING FOR RED FLAGS: Bowel or bladder incontinence:  No Cauda equina syndrome: No   COGNITION:           Overall cognitive status: Within functional limits for tasks assessed                          SENSATION: Not tested   MUSCLE LENGTH: Marcello Moores test: Mild limitation on Rt   POSTURE:  Noted lumbar dextroscoliosis noted, decreased lumbar lordosis   PALPATION: TTP with palpable trigger points along Lt thoracolumbar paraspinals   PASSIVE ACCESSORIES: Hypomobile CPAs T4-L5   LUMBAR ROM:    Active  AROM  03/02/2022 AROM 04/28/2022  Flexion WNL, p!! WNL, p!  Extension WNL, p! WNL, p!  Right lateral flexion WNL, p! WNL, p!  Left lateral flexion WNL, p! WNL, p!  Right rotation WNL WNL  Left rotation WNL, minor p! WNL   (Blank rows = not tested)     LE MMT:   MMT Right 03/02/2022 Left 03/02/2022 Right 04/28/2022 Left 04/28/2022  Hip flexion 4/5 4/5 4+/5 4+/5  Hip extension 4/5 4/5 5/5 5/5  Hip abduction 4+/5 4/5 5/5 5/5  Knee flexion 5/5 5/5    Knee extension 5/5 5/5    Ankle dorsiflexion 5/5 5/5    Ankle plantarflexion 5/5 5/5     (Blank rows = not tested)   SPECIAL TESTS:  PLE: (+) PIT: (-) Rib hump test: FABER: (-) FADDIR: (+) on Rt for anterior hip pain  FUNCTIONAL TESTS:  Squat: WNL 5xSTS: 9 seconds Plank: 70 seconds, terminated by PT   TODAY'S TREATMENT   [facility] Adult PT Treatment:                                                DATE: 04/28/2022 Therapeutic Exercise: Prone superman with hip abduction with YTB around ankles 3x10 Side knee plank with 10# dumbbell at hip x15 BIL Manual Therapy: Skilled palpation to identify trigger points prior to E-stim TPDN Effleurage to lumbar musculature following E-stim TPDN Neuromuscular re-ed: Attended E-stim TPDN to lumbar multifidi with parameters described below Therapeutic Activity: Re-assessment of objective measures with pt education Re-administration of FOTO with pt education Modalities: N/A Self Care: N/A  Trigger Point Dry-Needling  Treatment  instructions: Expect mild to moderate muscle soreness. S/S of pneumothorax if dry needled over a lung field, and to seek immediate medical attention should they occur. Patient verbalized understanding of these instructions and education.  Patient Consent Given: Yes Education handout provided: No Muscles treated: BIL L2-L4 lumbar multifidi Electrical stimulation performed: Yes Parameters:  55m, level 2 intensity x8 minutes Treatment response/outcome: Improved muscle extensibility following intervention   [facility] Adult PT Treatment:                                                DATE: 04/14/2022 Therapeutic Exercise: Bird dog with knee tap progression 3x10 BIL Alternating forward lunge with overhead pull with 7# cables 3x10 Straight arm reverse crunches with 2kg ball hand-off 3x8 Side knee plank with hip abduction with GTB 2x10 BIL Manual Therapy: Prone effleurage to BIL lumbar paraspinals x8 minutes Sidelying lumbar roll grade V manipulation x1 BIL with cavitation Neuromuscular re-ed: N/A Therapeutic Activity: N/A Modalities: N/A Self Care: N/A   [facility] Adult PT Treatment:                                                DATE: 04/05/2022 Therapeutic Exercise: Prone swimmers 3x20 with 2# ankle weights Straight-arm reverse crunches 3x8 Sidelying trunk rotation with GTB UE pull into side plank x10 BIL DKTC 2x30sec 75# hex bar RBenindead lift 3x10 Thomas stretch x1 min BIL Prone Superman with GTB hip abduction 2x10 with 3-sec hold DKTC x1 min Manual Therapy: N/A Neuromuscular re-ed: N/A Therapeutic Activity: In-depth pt education regarding pain neuroscience, central sensitization, broken alarm system, and prognosis Modalities: N/A Self Care: N/A         PATIENT EDUCATION:  Education details: Pt educated on probable underlying pathophysiology behind her pain presentation, POC, prognosis, FOTO, and HEP Person educated: Patient Education method: Explanation, Demonstration,  and Handouts Education comprehension: verbalized understanding and returned demonstration     HOME EXERCISE PROGRAM: Access Code: [email or web address]: [email or web address] Date: 03/02/2022 Prepared by: TVanessa [place]  Exercises - Standard Plank  - 1 x daily - 7 x weekly - 3 sets - to failure hold - Modified Side Plank with Hip Abduction  - 1 x daily - 7 x weekly - 2 sets - 10 reps - Sidelying Open Book Thoracic Lumbar Rotation and Extension  - 1 x daily -  7 x weekly - 2 sets - 10 reps - Marching Bridge  - 1 x daily - 7 x weekly - 3 sets - 20 reps      ASSESSMENT:   CLINICAL IMPRESSION: Upon re-assessment of pt goals, the pt has made good progress in global hip strength and BIL lumbar rotation AROM pain levels, although she remains limited by pain in most lumbar motions. She responded excellently to E-stim TPDN today with improved muscle extensibility and report of a therapeutic response. She also tolerated progressed exercises well. She will continue to benefit from skilled PT to address her primary impairments and return to her prior level of function with less limitation.     OBJECTIVE IMPAIRMENTS decreased mobility, decreased ROM, decreased strength, hypomobility, impaired flexibility, improper body mechanics, postural dysfunction, and pain.    ACTIVITY LIMITATIONS cleaning, community activity, driving, occupation, Medical sales representative, yard work, and shopping.    PERSONAL FACTORS Time since onset of injury/illness/exacerbation are also affecting patient's functional outcome.        GOALS: Goals reviewed with patient? Yes   SHORT TERM GOALS: Target date: 03/30/2022       Pt will report understanding and adherence to her HEP in order to promote independence in the management of her primary impairments. Baseline: HEP provided at eval 04/28/2022: Pt reports adherence to her HEP Goal status: ACHIEVED     LONG TERM GOALS: Target date: 04/27/2022    Pt will achieve a FOTO  score of 64% in order to demonstrate improved functional ability as it relates to her lumbar impairments. Baseline: 56% 04/28/2022: 57% Goal status: IN PROGRESS   2.  Pt will achieve full BIL trunk side flexion AROM with 0-2/10 pain in order to bend over to pick up groceries with less limitation. Baseline: 4-5/10 pain 04/28/2022: 5/10 pain Goal status: IN PROGRESS   3.  Pt will achieve BIL hip strength of 5/5 in order to progress her independent LE strengthening regimen. Baseline: See MMT chart 04/28/2022: 5/5 in BIL extension and abduction, 4+/5 in BIL flexion Goal status: IIN PROGRESS   4.  Pt will achieve full trunk flexion AROM with 0-2/10 pain in order to get dressed with less limitation. Baseline: 6/10 pain 04/28/2022: 5/10 pain Goal status: IN PROGRESS       PLAN: PT FREQUENCY: 1x/week   PT DURATION: 8 weeks   PLANNED INTERVENTIONS: Therapeutic exercises, Therapeutic activity, Neuromuscular re-education, Balance training, Gait training, Patient/Family education, Joint manipulation, Joint mobilization, Stair training, DME instructions, Aquatic Therapy, Dry Needling, Electrical stimulation, Spinal manipulation, Spinal mobilization, Cryotherapy, Moist heat, Taping, Traction, Biofeedback, Ionotophoresis '4mg'$ /ml Dexamethasone, Manual therapy, and Re-evaluation.   PLAN FOR NEXT SESSION: Further hip assessment, progress core/ hip strengthening, lumbar mobility, consider manual techniques/ OMT/ TPDN for pain reduction   Vanessa [place], PT, DPT 04/28/22 4:59 PM

## 2022-05-03 ENCOUNTER — Ambulatory Visit: Payer: 59

## 2022-05-10 ENCOUNTER — Ambulatory Visit: Payer: 59

## 2022-05-10 DIAGNOSIS — M5459 Other low back pain: Secondary | ICD-10-CM | POA: Diagnosis not present

## 2022-05-10 DIAGNOSIS — M6281 Muscle weakness (generalized): Secondary | ICD-10-CM | POA: Diagnosis not present

## 2022-05-10 NOTE — Therapy (Signed)
OUTPATIENT PHYSICAL THERAPY TREATMENT NOTE/ RE-CERTIFICATION   Patient Name: Joanne Booth MRN: [id number] DOB:November 29, 1965, 56 y.o., female Today's Date: 05/10/2022  PCP: Louretta Shorten, MD REFERRING PROVIDER: Dawley, Theodoro Doing, DO   PT End of Session - 05/10/22 1705     Visit Number 7    Number of Visits 12    Date for PT Re-Evaluation 06/16/22    Authorization Type MCE    Authorization Time Period FOTO v6, v10    PT Start Time 1705    PT Stop Time 1743    PT Time Calculation (min) 38 min    Activity Tolerance Patient tolerated treatment well    Behavior During Therapy WFL for tasks assessed/performed                  Past Medical History:  Diagnosis Date   Basal cell carcinoma    Past Surgical History:  Procedure Laterality Date   basal cell skin cancer removals     Patient Active Problem List   Diagnosis Date Noted   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 However, she reports excellent relief following TPDN at last visit which lasted several days. She also reports HEP adherence.   PAIN:  Are you having pain? 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 Moderate rotary dextroscoliosis.    PATIENT SURVEYS:  FOTO 56%, predicted 64% in 11 visits 04/28/2022: 57%   SCREENING FOR RED FLAGS: Bowel or bladder incontinence: No Cauda equina  syndrome: No   COGNITION:           Overall cognitive status: Within functional limits for tasks assessed                          SENSATION: Not tested   MUSCLE LENGTH: Marcello Moores test: Mild limitation on Rt   POSTURE:  Noted lumbar dextroscoliosis noted, decreased lumbar lordosis   PALPATION: TTP with palpable trigger points along Lt thoracolumbar paraspinals   PASSIVE ACCESSORIES: Hypomobile CPAs T4-L5   LUMBAR ROM:    Active  AROM  03/02/2022 AROM 04/28/2022  Flexion WNL, p!! WNL, p!  Extension WNL, p! WNL, p!  Right lateral flexion WNL, p! WNL, p!  Left lateral flexion WNL, p! WNL, p!  Right rotation WNL WNL  Left rotation WNL, minor p! WNL   (Blank rows = not tested)     LE MMT:   MMT Right 03/02/2022 Left 03/02/2022 Right 04/28/2022 Left 04/28/2022  Hip flexion 4/5 4/5 4+/5 4+/5  Hip extension 4/5 4/5 5/5 5/5  Hip abduction 4+/5 4/5 5/5 5/5  Knee flexion 5/5 5/5    Knee extension 5/5 5/5    Ankle dorsiflexion 5/5 5/5    Ankle plantarflexion 5/5 5/5     (Blank rows = not tested)   SPECIAL TESTS:  PLE: (+) PIT: (-) Rib hump test: FABER: (-) FADDIR: (+) on Rt for anterior hip pain   FUNCTIONAL TESTS:  Squat: WNL  5xSTS: 9 seconds Plank: 70 seconds, terminated by PT   TODAY'S TREATMENT   [facility] Adult PT Treatment:                                                DATE: 05/10/2022 Therapeutic Exercise: Tall-kneeling hip thrust with 20# cable 3x10 Alternating forward lunge with overhead pull with 7# cables at [employer] machine 2x10 Standing straight-arm abdominal press-down with 23# cable 2x10 Manual Therapy: Skilled palpation to identify trigger points prior to E-stim TPDN Effleurage to lumbar musculature following E-stim TPDN Neuromuscular re-ed: Attended E-stim TPDN to lumbar multifidi with parameters described below Therapeutic Activity: N/A Modalities: N/A Self Care: N/A  Trigger Point Dry-Needling  Treatment instructions: Expect mild to  moderate muscle soreness. S/S of pneumothorax if dry needled over a lung field, and to seek immediate medical attention should they occur. Patient verbalized understanding of these instructions and education.  Patient Consent Given: Yes Education handout provided: No Muscles treated: BIL L2-L4 lumbar multifidi Electrical stimulation performed: Yes Parameters: 28m, level 4 intensity x8 minutes Treatment response/outcome: Improved muscle extensibility following intervention  [facility] Adult PT Treatment:                                                DATE: 04/28/2022 Therapeutic Exercise: Prone superman with hip abduction with YTB around ankles 3x10 Side knee plank with 10# dumbbell at hip x15 BIL Manual Therapy: Skilled palpation to identify trigger points prior to E-stim TPDN Effleurage to lumbar musculature following E-stim TPDN Neuromuscular re-ed: Attended E-stim TPDN to lumbar multifidi with parameters described below Therapeutic Activity: Re-assessment of objective measures with pt education Re-administration of FOTO with pt education Modalities: N/A Self Care: N/A  Trigger Point Dry-Needling  Treatment instructions: Expect mild to moderate muscle soreness. S/S of pneumothorax if dry needled over a lung field, and to seek immediate medical attention should they occur. Patient verbalized understanding of these instructions and education.  Patient Consent Given: Yes Education handout provided: No Muscles treated: BIL L2-L4 lumbar multifidi Electrical stimulation performed: Yes Parameters:  235m level 2 intensity x8 minutes Treatment response/outcome: Improved muscle extensibility following intervention   [facility] Adult PT Treatment:                                                DATE: 04/14/2022 Therapeutic Exercise: [employer] [place] with knee tap progression 3x10 BIL Alternating forward lunge with overhead pull with 7# cables 3x10 Straight arm reverse crunches with 2kg ball hand-off  3x8 Side knee plank with hip abduction with GTB 2x10 BIL Manual Therapy: Prone effleurage to BIL lumbar paraspinals x8 minutes Sidelying lumbar roll grade V manipulation x1 BIL with cavitation Neuromuscular re-ed: N/A Therapeutic Activity: N/A Modalities: N/A Self Care: N/A        PATIENT EDUCATION:  Education details: Pt educated on probable underlying pathophysiology behind her pain presentation, POC, prognosis, FOTO, and HEP Person educated: Patient Education method: Explanation, Demonstration, and Handouts Education comprehension: verbalized understanding and returned demonstration     HOME EXERCISE PROGRAM: Access Code: [email or web address]: [email or web address] Date: 03/02/2022 Prepared by: TuVanessa Levy  Exercises - Standard Plank  - 1 x daily - 7 x weekly - 3 sets - to failure hold - Modified Side Plank with Hip Abduction  - 1 x daily - 7 x weekly - 2 sets - 10 reps - Sidelying Open Book Thoracic Lumbar Rotation and Extension  - 1 x daily - 7 x weekly - 2 sets - 10 reps - Marching Bridge  - 1 x daily - 7 x weekly - 3 sets - 20 reps      ASSESSMENT:   CLINICAL IMPRESSION: Pt reports an excellent therapeutic response to E-stim TPDN at last visit. This was utilized again today to good effect. She also responded well to progressed hip and core strengthening exercises and reports a therapeutic response to manual techniques today. She will continue to benefit from skilled PT to address her primary impairments and return to her prior level of function with less limitation.      OBJECTIVE IMPAIRMENTS decreased mobility, decreased ROM, decreased strength, hypomobility, impaired flexibility, improper body mechanics, postural dysfunction, and pain.    ACTIVITY LIMITATIONS cleaning, community activity, driving, occupation, Medical sales representative, yard work, and shopping.    PERSONAL FACTORS Time since onset of injury/illness/exacerbation are also affecting patient's  functional outcome.        GOALS: Goals reviewed with patient? Yes   SHORT TERM GOALS: Target date: 03/30/2022       Pt will report understanding and adherence to her HEP in order to promote independence in the management of her primary impairments. Baseline: HEP provided at eval 04/28/2022: Pt reports adherence to her HEP Goal status: ACHIEVED     LONG TERM GOALS: Target date: 04/27/2022    Pt will achieve a FOTO score of 64% in order to demonstrate improved functional ability as it relates to her lumbar impairments. Baseline: 56% 04/28/2022: 57% Goal status: IN PROGRESS   2.  Pt will achieve full BIL trunk side flexion AROM with 0-2/10 pain in order to bend over to pick up groceries with less limitation. Baseline: 4-5/10 pain 04/28/2022: 5/10 pain Goal status: IN PROGRESS   3.  Pt will achieve BIL hip strength of 5/5 in order to progress her independent LE strengthening regimen. Baseline: See MMT chart 04/28/2022: 5/5 in BIL extension and abduction, 4+/5 in BIL flexion Goal status: IIN PROGRESS   4.  Pt will achieve full trunk flexion AROM with 0-2/10 pain in order to get dressed with less limitation. Baseline: 6/10 pain 04/28/2022: 5/10 pain Goal status: IN PROGRESS       PLAN: PT FREQUENCY: 1x/week   PT DURATION: 8 weeks   PLANNED INTERVENTIONS: Therapeutic exercises, Therapeutic activity, Neuromuscular re-education, Balance training, Gait training, Patient/Family education, Joint manipulation, Joint mobilization, Stair training, DME instructions, Aquatic Therapy, Dry Needling, Electrical stimulation, Spinal manipulation, Spinal mobilization, Cryotherapy, Moist heat, Taping, Traction, Biofeedback, Ionotophoresis '4mg'$ /ml Dexamethasone, Manual therapy, and Re-evaluation.   PLAN FOR NEXT SESSION: Further hip assessment, progress core/ hip strengthening, lumbar mobility, consider manual techniques/ OMT/ TPDN for pain reduction   Vanessa [place], PT, DPT 05/10/22 5:45  PM

## 2022-05-16 ENCOUNTER — Other Ambulatory Visit (HOSPITAL_COMMUNITY): Payer: Self-pay

## 2022-05-18 ENCOUNTER — Ambulatory Visit: Payer: 59

## 2022-05-18 DIAGNOSIS — M5459 Other low back pain: Secondary | ICD-10-CM | POA: Diagnosis not present

## 2022-05-18 DIAGNOSIS — Z681 Body mass index (BMI) 19 or less, adult: Secondary | ICD-10-CM | POA: Diagnosis not present

## 2022-05-31 ENCOUNTER — Ambulatory Visit: Payer: 59 | Attending: Neurological Surgery

## 2022-05-31 DIAGNOSIS — M545 Low back pain, unspecified: Secondary | ICD-10-CM | POA: Diagnosis not present

## 2022-05-31 DIAGNOSIS — M62830 Muscle spasm of back: Secondary | ICD-10-CM | POA: Diagnosis not present

## 2022-05-31 DIAGNOSIS — M6281 Muscle weakness (generalized): Secondary | ICD-10-CM | POA: Diagnosis not present

## 2022-05-31 DIAGNOSIS — M5459 Other low back pain: Secondary | ICD-10-CM | POA: Insufficient documentation

## 2022-05-31 NOTE — Therapy (Signed)
OUTPATIENT PHYSICAL THERAPY TREATMENT NOTE/ RE-CERTIFICATION   Patient Name: Joanne Booth MRN: [id number] DOB:02/23/1966, 56 y.o., female Today's Date: 05/31/2022  PCP: Louretta Shorten, MD REFERRING PROVIDER: Karsten Ro, DO   PT End of Session - 05/31/22 1632     Visit Number 8    Number of Visits 12    Date for PT Re-Evaluation 06/16/22    Authorization Type MCE    Authorization Time Period FOTO v6, v10    PT Start Time 1633    PT Stop Time 1658    PT Time Calculation (min) 25 min    Activity Tolerance Patient tolerated treatment well    Behavior During Therapy WFL for tasks assessed/performed                   Past Medical History:  Diagnosis Date   Basal cell carcinoma    Past Surgical History:  Procedure Laterality Date   basal cell skin cancer removals     Patient Active Problem List   Diagnosis Date Noted   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 She reports no significant change in symptoms. Pt reports that being on her feet all day at work increases her LBP. Pt reports adherence to her HEP.  PAIN:  Are you having pain? 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 Moderate rotary dextroscoliosis.    PATIENT SURVEYS:  FOTO 56%, predicted 64% in 11 visits 04/28/2022: 57%    SCREENING FOR RED FLAGS: Bowel or bladder incontinence: No Cauda equina syndrome: No   COGNITION:           Overall cognitive status: Within functional limits for tasks assessed                          SENSATION: Not tested   MUSCLE LENGTH: Marcello Moores test: Mild limitation on Rt   POSTURE:  Noted lumbar dextroscoliosis noted, decreased lumbar lordosis   PALPATION: TTP with palpable trigger points along Lt thoracolumbar paraspinals   PASSIVE ACCESSORIES: Hypomobile CPAs T4-L5   LUMBAR ROM:    Active  AROM  03/02/2022 AROM 04/28/2022  Flexion WNL, p!! WNL, p!  Extension WNL, p! WNL, p!  Right lateral flexion WNL, p! WNL, p!  Left lateral flexion WNL, p! WNL, p!  Right rotation WNL WNL  Left rotation WNL, minor p! WNL   (Blank rows = not tested)     LE MMT:   MMT Right 03/02/2022 Left 03/02/2022 Right 04/28/2022 Left 04/28/2022  Hip flexion 4/5 4/5 4+/5 4+/5  Hip extension 4/5 4/5 5/5 5/5  Hip abduction 4+/5 4/5 5/5 5/5  Knee flexion 5/5 5/5    Knee extension 5/5 5/5    Ankle dorsiflexion 5/5 5/5    Ankle plantarflexion 5/5 5/5     (Blank rows = not tested)   SPECIAL TESTS:  PLE: (+) PIT: (-) Rib hump test: FABER: (-)  FADDIR: (+) on Rt for anterior hip pain   FUNCTIONAL TESTS:  Squat: WNL 5xSTS: 9 seconds Plank: 70 seconds, terminated by PT   TODAY'S TREATMENT   [facility] Adult PT Treatment:                                                DATE: 05/31/2022 Therapeutic Exercise: Sidelying lumbar open books x10 BIL Supine bicycle kicks 2x14 DKTC stretch 3x30 seconds Bridge isometric with marching with YTB around forefeet 3x14 BIL Manual Therapy: Effleurage/ STM to BIL lumbar paraspinals/ QL x8 min Neuromuscular re-ed: N/A Therapeutic Activity: N/A Modalities: N/A Self Care: N/A  [facility] Adult PT Treatment:                                                DATE: 05/10/2022 Therapeutic Exercise: Tall-kneeling hip thrust with 20# cable 3x10 Alternating forward  lunge with overhead pull with 7# cables at [employer] machine 2x10 Standing straight-arm abdominal press-down with 23# cable 2x10 Manual Therapy: Skilled palpation to identify trigger points prior to E-stim TPDN Effleurage to lumbar musculature following E-stim TPDN Neuromuscular re-ed: Attended E-stim TPDN to lumbar multifidi with parameters described below Therapeutic Activity: N/A Modalities: N/A Self Care: N/A  Trigger Point Dry-Needling  Treatment instructions: Expect mild to moderate muscle soreness. S/S of pneumothorax if dry needled over a lung field, and to seek immediate medical attention should they occur. Patient verbalized understanding of these instructions and education.  Patient Consent Given: Yes Education handout provided: No Muscles treated: BIL L2-L4 lumbar multifidi Electrical stimulation performed: Yes Parameters: 56m, level 4 intensity x8 minutes Treatment response/outcome: Improved muscle extensibility following intervention  [facility] Adult PT Treatment:                                                DATE: 04/28/2022 Therapeutic Exercise: Prone superman with hip abduction with YTB around ankles 3x10 Side knee plank with 10# dumbbell at hip x15 BIL Manual Therapy: Skilled palpation to identify trigger points prior to E-stim TPDN Effleurage to lumbar musculature following E-stim TPDN Neuromuscular re-ed: Attended E-stim TPDN to lumbar multifidi with parameters described below Therapeutic Activity: Re-assessment of objective measures with pt education Re-administration of FOTO with pt education Modalities: N/A Self Care: N/A  Trigger Point Dry-Needling  Treatment instructions: Expect mild to moderate muscle soreness. S/S of pneumothorax if dry needled over a lung field, and to seek immediate medical attention should they occur. Patient verbalized understanding of these instructions and education.  Patient Consent Given: Yes Education handout provided:  No Muscles treated: BIL L2-L4 lumbar multifidi Electrical stimulation performed: Yes Parameters:  240m level 2 intensity x8 minutes Treatment response/outcome: Improved muscle extensibility following intervention       PATIENT EDUCATION:  Education details: Pt educated on probable underlying pathophysiology behind her pain presentation, POC, prognosis, FOTO, and HEP Person educated: Patient Education method: ExConsulting civil engineerDemonstration, and Handouts Education comprehension: verbalized understanding and returned demonstration     HOME EXERCISE PROGRAM: Access Code: [email or web address]: [email or web address] Date: 03/02/2022 Prepared by: TuVanessa [place] Exercises - Standard Plank  - 1 x  daily - 7 x weekly - 3 sets - to failure hold - Modified Side Plank with Hip Abduction  - 1 x daily - 7 x weekly - 2 sets - 10 reps - Sidelying Open Book Thoracic Lumbar Rotation and Extension  - 1 x daily - 7 x weekly - 2 sets - 10 reps - Marching Bridge  - 1 x daily - 7 x weekly - 3 sets - 20 reps      ASSESSMENT:   CLINICAL IMPRESSION: Due to pt arriving 15 minutes late to her appointment, the session was truncated today. She responded well to trunk ROM and core strengthening, demonstrating good form and mild pain with exercises. Pt will continue to benefit from skilled PT to address her primary impairments and return to her prior level of function with less limitation.      OBJECTIVE IMPAIRMENTS decreased mobility, decreased ROM, decreased strength, hypomobility, impaired flexibility, improper body mechanics, postural dysfunction, and pain.    ACTIVITY LIMITATIONS cleaning, community activity, driving, occupation, Medical sales representative, yard work, and shopping.    PERSONAL FACTORS Time since onset of injury/illness/exacerbation are also affecting patient's functional outcome.        GOALS: Goals reviewed with patient? Yes   SHORT TERM GOALS: Target date: 03/30/2022       Pt will report  understanding and adherence to her HEP in order to promote independence in the management of her primary impairments. Baseline: HEP provided at eval 04/28/2022: Pt reports adherence to her HEP Goal status: ACHIEVED     LONG TERM GOALS: Target date: 04/27/2022    Pt will achieve a FOTO score of 64% in order to demonstrate improved functional ability as it relates to her lumbar impairments. Baseline: 56% 04/28/2022: 57% Goal status: IN PROGRESS   2.  Pt will achieve full BIL trunk side flexion AROM with 0-2/10 pain in order to bend over to pick up groceries with less limitation. Baseline: 4-5/10 pain 04/28/2022: 5/10 pain Goal status: IN PROGRESS   3.  Pt will achieve BIL hip strength of 5/5 in order to progress her independent LE strengthening regimen. Baseline: See MMT chart 04/28/2022: 5/5 in BIL extension and abduction, 4+/5 in BIL flexion Goal status: IIN PROGRESS   4.  Pt will achieve full trunk flexion AROM with 0-2/10 pain in order to get dressed with less limitation. Baseline: 6/10 pain 04/28/2022: 5/10 pain Goal status: IN PROGRESS       PLAN: PT FREQUENCY: 1x/week   PT DURATION: 8 weeks   PLANNED INTERVENTIONS: Therapeutic exercises, Therapeutic activity, Neuromuscular re-education, Balance training, Gait training, Patient/Family education, Joint manipulation, Joint mobilization, Stair training, DME instructions, Aquatic Therapy, Dry Needling, Electrical stimulation, Spinal manipulation, Spinal mobilization, Cryotherapy, Moist heat, Taping, Traction, Biofeedback, Ionotophoresis '4mg'$ /ml Dexamethasone, Manual therapy, and Re-evaluation.   PLAN FOR NEXT SESSION: Further hip assessment, progress core/ hip strengthening, lumbar mobility, consider manual techniques/ OMT/ TPDN for pain reduction   Vanessa Elba, PT, DPT 05/31/22 4:59 PM

## 2022-06-07 ENCOUNTER — Ambulatory Visit: Payer: 59

## 2022-06-07 DIAGNOSIS — M6281 Muscle weakness (generalized): Secondary | ICD-10-CM | POA: Diagnosis not present

## 2022-06-07 DIAGNOSIS — M62830 Muscle spasm of back: Secondary | ICD-10-CM | POA: Diagnosis not present

## 2022-06-07 DIAGNOSIS — M5459 Other low back pain: Secondary | ICD-10-CM

## 2022-06-07 DIAGNOSIS — M545 Low back pain, unspecified: Secondary | ICD-10-CM

## 2022-06-07 NOTE — Therapy (Signed)
OUTPATIENT PHYSICAL THERAPY TREATMENT NOTE   Patient Name: Joanne Booth MRN: [id number] DOB:[birth year], 56 y.o., female Today's Date: 06/07/2022  PCP: Louretta Shorten, MD REFERRING PROVIDER: Dawley, Theodoro Doing, DO   PT End of Session - 06/07/22 1623     Visit Number 9    Number of Visits 12    Date for PT Re-Evaluation 06/16/22    Authorization Type MCE    Authorization Time Period FOTO v6, v10    PT Start Time 1622    PT Stop Time 1700    PT Time Calculation (min) 38 min    Activity Tolerance Patient tolerated treatment well    Behavior During Therapy WFL for tasks assessed/performed                    Past Medical History:  Diagnosis Date   Basal cell carcinoma    Past Surgical History:  Procedure Laterality Date   basal cell skin cancer removals     Patient Active Problem List   Diagnosis Date Noted   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 She reports that she feels like there's a big knot in her low back.   PAIN:  Are you having pain? Yes: NPRS scale: 2/10 Pain location: Lt lumbar Pain description: dull ache, occasional electric shock Aggravating factors: bending forward, trunk rotation Relieving factors: positional changes, stretches, Voltaren, massage gun  OBJECTIVE:  DIAGNOSTIC FINDINGS:  05/26/2021: XR Lumbar Spine: IMPRESSION: Multiple level degenerative changes of lumbar spine. Moderate rotary dextroscoliosis.    PATIENT SURVEYS:  FOTO 56%, predicted 64% in 11 visits 04/28/2022: 57%   SCREENING FOR RED FLAGS: Bowel or bladder  incontinence: No Cauda equina syndrome: No   COGNITION:           Overall cognitive status: Within functional limits for tasks assessed                          SENSATION: Not tested   MUSCLE LENGTH: Marcello Moores test: Mild limitation on Rt   POSTURE:  Noted lumbar dextroscoliosis noted, decreased lumbar lordosis   PALPATION: TTP with palpable trigger points along Lt thoracolumbar paraspinals   PASSIVE ACCESSORIES: Hypomobile CPAs T4-L5   LUMBAR ROM:    Active  AROM  03/02/2022 AROM 04/28/2022  Flexion WNL, p!! WNL, p!  Extension WNL, p! WNL, p!  Right lateral flexion WNL, p! WNL, p!  Left lateral flexion WNL, p! WNL, p!  Right rotation WNL WNL  Left rotation WNL, minor p! WNL   (Blank rows = not tested)     LE MMT:   MMT Right 03/02/2022 Left 03/02/2022 Right 04/28/2022 Left 04/28/2022  Hip flexion 4/5 4/5 4+/5 4+/5  Hip extension 4/5 4/5 5/5 5/5  Hip abduction 4+/5 4/5 5/5 5/5  Knee flexion 5/5 5/5    Knee extension 5/5 5/5    Ankle dorsiflexion 5/5 5/5    Ankle plantarflexion 5/5 5/5     (Blank rows = not tested)   SPECIAL TESTS:  PLE: (+) PIT: (-) Rib hump test: FABER: (-) FADDIR: (+) on  Rt for anterior hip pain   FUNCTIONAL TESTS:  Squat: WNL 5xSTS: 9 seconds Plank: 70 seconds, terminated by PT   TODAY'S TREATMENT   [facility] Adult PT Treatment:                                                DATE: 06/07/2022 Therapeutic Exercise: Prone superman with hip abduction with YTB around ankles 3x10 Sidelying with hip on Bosu ball with side trunk flexion with PT holding down LE 2x10 BIL Supine [place] twists with 2kg ball 3x16 Manual Therapy: Sidelying lumbar roll grade V manipulation x1 BIL with cavitation Skilled palpation to identify trigger points prior to E-stim TPDN Effleurage to lumbar musculature following E-stim TPDN Neuromuscular re-ed: Attended E-stim TPDN to lumbar multifidi with parameters described below Trigger Point Dry-Needling  Treatment  instructions: Expect mild to moderate muscle soreness. S/S of pneumothorax if dry needled over a lung field, and to seek immediate medical attention should they occur. Patient verbalized understanding of these instructions and education.  Patient Consent Given: Yes Education handout provided: No Muscles treated: BIL L2-L4 lumbar multifidi Electrical stimulation performed: Yes Parameters: 80m, level 3 intensity x8 minutes Treatment response/outcome: Improved muscle extensibility following intervention  Therapeutic Activity: N/A Modalities: N/A Self Care: N/A   [facility] Adult PT Treatment:                                                DATE: 05/31/2022 Therapeutic Exercise: Sidelying lumbar open books x10 BIL Supine bicycle kicks 2x14 DKTC stretch 3x30 seconds Bridge isometric with marching with YTB around forefeet 3x14 BIL Manual Therapy: Effleurage/ STM to BIL lumbar paraspinals/ QL x8 min Neuromuscular re-ed: N/A Therapeutic Activity: N/A Modalities: N/A Self Care: N/A  [facility] Adult PT Treatment:                                                DATE: 05/10/2022 Therapeutic Exercise: Tall-kneeling hip thrust with 20# cable 3x10 Alternating forward lunge with overhead pull with 7# cables at FParker [employer]inmachine 2x10 Standing straight-arm abdominal press-down with 23# cable 2x10 Manual Therapy: Skilled palpation to identify trigger points prior to E-stim TPDN Effleurage to lumbar musculature following E-stim TPDN Neuromuscular re-ed: Attended E-stim TPDN to lumbar multifidi with parameters described below Therapeutic Activity: N/A Modalities: N/A Self Care: N/A  Trigger Point Dry-Needling  Treatment instructions: Expect mild to moderate muscle soreness. S/S of pneumothorax if dry needled over a lung field, and to seek immediate medical attention should they occur. Patient verbalized understanding of these instructions and education.  Patient Consent Given: Yes Education  handout provided: No Muscles treated: BIL L2-L4 lumbar multifidi Electrical stimulation performed: Yes Parameters: 26m level 4 intensity x8 minutes Treatment response/outcome: Improved muscle extensibility following intervention      PATIENT EDUCATION:  Education details: Pt educated on probable underlying pathophysiology behind her pain presentation, POC, prognosis, FOTO, and HEP Person educated: Patient Education method: ExConsulting civil engineerDemonstration, and Handouts Education comprehension: verbalized understanding and returned demonstration     HOME EXERCISE PROGRAM: Access Code: [email or web address]: [email or web address] Date: 03/02/2022 Prepared by: TuVanessa [place]  Exercises - Standard Plank  - 1 x daily - 7 x weekly - 3 sets - to failure hold - Modified Side Plank with Hip Abduction  - 1 x daily - 7 x weekly - 2 sets - 10 reps - Sidelying Open Book Thoracic Lumbar Rotation and Extension  - 1 x daily - 7 x weekly - 2 sets - 10 reps - Marching Bridge  - 1 x daily - 7 x weekly - 3 sets - 20 reps      ASSESSMENT:   CLINICAL IMPRESSION: Pt responded excellently to pain mitigation techniques today, with improved muscle extensibility, passive accessory mobility, and pain levels following E-stim TPDN and OMT. She was able to tolerate core strengthening exercises with no pain. Pt will continue to benefit from skilled PT to address her primary impairments and return to her prior level of function with less limitation.     OBJECTIVE IMPAIRMENTS decreased mobility, decreased ROM, decreased strength, hypomobility, impaired flexibility, improper body mechanics, postural dysfunction, and pain.    ACTIVITY LIMITATIONS cleaning, community activity, driving, occupation, Medical sales representative, yard work, and shopping.    PERSONAL FACTORS Time since onset of injury/illness/exacerbation are also affecting patient's functional outcome.        GOALS: Goals reviewed with patient? Yes   SHORT  TERM GOALS: Target date: 03/30/2022       Pt will report understanding and adherence to her HEP in order to promote independence in the management of her primary impairments. Baseline: HEP provided at eval 04/28/2022: Pt reports adherence to her HEP Goal status: ACHIEVED     LONG TERM GOALS: Target date: 04/27/2022    Pt will achieve a FOTO score of 64% in order to demonstrate improved functional ability as it relates to her lumbar impairments. Baseline: 56% 04/28/2022: 57% Goal status: IN PROGRESS   2.  Pt will achieve full BIL trunk side flexion AROM with 0-2/10 pain in order to bend over to pick up groceries with less limitation. Baseline: 4-5/10 pain 04/28/2022: 5/10 pain Goal status: IN PROGRESS   3.  Pt will achieve BIL hip strength of 5/5 in order to progress her independent LE strengthening regimen. Baseline: See MMT chart 04/28/2022: 5/5 in BIL extension and abduction, 4+/5 in BIL flexion Goal status: IIN PROGRESS   4.  Pt will achieve full trunk flexion AROM with 0-2/10 pain in order to get dressed with less limitation. Baseline: 6/10 pain 04/28/2022: 5/10 pain Goal status: IN PROGRESS       PLAN: PT FREQUENCY: 1x/week   PT DURATION: 8 weeks   PLANNED INTERVENTIONS: Therapeutic exercises, Therapeutic activity, Neuromuscular re-education, Balance training, Gait training, Patient/Family education, Joint manipulation, Joint mobilization, Stair training, DME instructions, Aquatic Therapy, Dry Needling, Electrical stimulation, Spinal manipulation, Spinal mobilization, Cryotherapy, Moist heat, Taping, Traction, Biofeedback, Ionotophoresis '4mg'$ /ml Dexamethasone, Manual therapy, and Re-evaluation.   PLAN FOR NEXT SESSION: Further hip assessment, progress core/ hip strengthening, lumbar mobility, consider manual techniques/ OMT/ TPDN for pain reduction   Vanessa [place], PT, DPT 06/07/22 5:00 PM

## 2022-06-15 ENCOUNTER — Ambulatory Visit: Payer: 59

## 2022-06-15 ENCOUNTER — Telehealth: Payer: Self-pay

## 2022-06-15 NOTE — Telephone Encounter (Signed)
Spoke with pt regarding first no show. She reports understanding of clinic attendance policy and rescheduled for tomorrow.

## 2022-06-16 ENCOUNTER — Ambulatory Visit: Payer: 59

## 2022-06-16 DIAGNOSIS — M545 Low back pain, unspecified: Secondary | ICD-10-CM

## 2022-06-16 DIAGNOSIS — M6281 Muscle weakness (generalized): Secondary | ICD-10-CM

## 2022-06-16 DIAGNOSIS — M62830 Muscle spasm of back: Secondary | ICD-10-CM | POA: Diagnosis not present

## 2022-06-16 DIAGNOSIS — M5459 Other low back pain: Secondary | ICD-10-CM | POA: Diagnosis not present

## 2022-06-16 NOTE — Therapy (Signed)
OUTPATIENT PHYSICAL THERAPY TREATMENT NOTE/ DISCHARGE SUMMARY   Patient Name: Joanne Booth MRN: [id number] DOB:[birth year], 56 y.o., female Today's Date: 06/16/2022  PCP: Louretta Shorten, MD REFERRING PROVIDER: Dawley, Theodoro Doing, DO   PT End of Session - 06/16/22 1711     Visit Number 10    Number of Visits 12    Date for PT Re-Evaluation 06/16/22    Authorization Type MCE    Authorization Time Period FOTO v6, v10    PT Start Time 1702    PT Stop Time 1740    PT Time Calculation (min) 38 min    Activity Tolerance Patient tolerated treatment well    Behavior During Therapy WFL for tasks assessed/performed                     Past Medical History:  Diagnosis Date   Basal cell carcinoma    Past Surgical History:  Procedure Laterality Date   basal cell skin cancer removals     Patient Active Problem List   Diagnosis Date Noted   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 Pt reports she is scheduled for a lumbar nerve root ablation in September. She reports feeling ready for discharge at this time to continue to independently progress her HEP.  PAIN:  Are you having pain? 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 Moderate rotary  dextroscoliosis.    PATIENT SURVEYS:  FOTO 56%, predicted 64% in 11 visits 04/28/2022: 57% 06/16/2022: 56%   SCREENING FOR RED FLAGS: Bowel or bladder incontinence: No Cauda equina syndrome: No   COGNITION:           Overall cognitive status: Within functional limits for tasks assessed                          SENSATION: Not tested   MUSCLE LENGTH: Marcello Moores test: Mild limitation on Rt   POSTURE:  Noted lumbar dextroscoliosis noted, decreased lumbar lordosis   PALPATION: TTP with palpable trigger points along Lt thoracolumbar paraspinals   PASSIVE ACCESSORIES: Hypomobile CPAs T4-L5   LUMBAR ROM:    Active  AROM  03/02/2022 AROM 04/28/2022 AROM 06/16/2022  Flexion WNL, p!! WNL, p! WNL  Extension WNL, p! WNL, p! WNL, p!  Right lateral flexion WNL, p! WNL, p! WNL  Left lateral flexion WNL, p! WNL, p! WNL, p!  Right rotation WNL WNL   Left rotation WNL, minor p! WNL    (Blank rows = not tested)     LE MMT:   MMT Right 03/02/2022 Left 03/02/2022 Right 04/28/2022 Left 04/28/2022 Right 06/16/2022 Left 06/16/2022  Hip flexion 4/5 4/5 4+/5 4+/5 5/5 5/5  Hip extension 4/5 4/5 5/5 5/5    Hip abduction 4+/5 4/5 5/5 5/5    Knee flexion 5/5 5/5      Knee extension  5/5 5/5      Ankle dorsiflexion 5/5 5/5      Ankle plantarflexion 5/5 5/5       (Blank rows = not tested)   SPECIAL TESTS:  PLE: (+) PIT: (-) Rib hump test: FABER: (-) FADDIR: (+) on Rt for anterior hip pain   FUNCTIONAL TESTS:  Squat: WNL 5xSTS: 9 seconds Plank: 70 seconds, terminated by PT   TODAY'S TREATMENT   [facility] Adult PT Treatment:                                                DATE: 06/16/2022 Therapeutic Exercise: Supine bicycles with handhold resistance at thighs 3x20 Supine DKTC stretch 3x30sec Prone cobra stretch 2x30sec Prone superman 3x10 Dead lift with two 10# dumbbells 3x10 Manual Therapy: N/A Neuromuscular re-ed: N/A Therapeutic Activity: Re-assessment of objective measures with pt  education Re-administration of FOTO with pt education Update to HEP with pt education Modalities: N/A Self Care: N/A   [facility] Adult PT Treatment:                                                DATE: 06/07/2022 Therapeutic Exercise: Prone superman with hip abduction with YTB around ankles 3x10 Sidelying with hip on Bosu ball with side trunk flexion with PT holding down LE 2x10 BIL Supine [place] twists with 2kg ball 3x16 Manual Therapy: Sidelying lumbar roll grade V manipulation x1 BIL with cavitation Skilled palpation to identify trigger points prior to E-stim TPDN Effleurage to lumbar musculature following E-stim TPDN Neuromuscular re-ed: Attended E-stim TPDN to lumbar multifidi with parameters described below Trigger Point Dry-Needling  Treatment instructions: Expect mild to moderate muscle soreness. S/S of pneumothorax if dry needled over a lung field, and to seek immediate medical attention should they occur. Patient verbalized understanding of these instructions and education.  Patient Consent Given: Yes Education handout provided: No Muscles treated: BIL L2-L4 lumbar multifidi Electrical stimulation performed: Yes Parameters: 83m, level 3 intensity x8 minutes Treatment response/outcome: Improved muscle extensibility following intervention  Therapeutic Activity: N/A Modalities: N/A Self Care: N/A   [facility] Adult PT Treatment:                                                DATE: 05/31/2022 Therapeutic Exercise: Sidelying lumbar open books x10 BIL Supine bicycle kicks 2x14 DKTC stretch 3x30 seconds Bridge isometric with marching with YTB around forefeet 3x14 BIL Manual Therapy: Effleurage/ STM to BIL lumbar paraspinals/ QL x8 min Neuromuscular re-ed: N/A Therapeutic Activity: N/A Modalities: N/A Self Care: N/A      PATIENT EDUCATION:  Education details: Pt educated on probable underlying pathophysiology behind her pain presentation, POC, prognosis, FOTO, and  HEP Person educated: Patient Education method: Explanation, Demonstration, and Handouts Education comprehension: verbalized understanding and returned demonstration     HOME EXERCISE PROGRAM: Access Code: [email or web address]: [email or web address] Date: 03/02/2022 Prepared by: TVanessa [place]  Exercises - Standard Plank  - 1 x daily - 7 x weekly - 3 sets - to failure hold - Modified Side Plank with Hip Abduction  - 1 x daily -  7 x weekly - 2 sets - 10 reps - Sidelying Open Book Thoracic Lumbar Rotation and Extension  - 1 x daily - 7 x weekly - 2 sets - 10 reps - Marching Bridge  - 1 x daily - 7 x weekly - 3 sets - 20 reps  Added 06/16/2022: - Supine Bicycles  - 1 x daily - 7 x weekly - 3 sets - 20 reps - Full Superman on Table  - 1 x daily - 7 x weekly - 3 sets - 10 reps - 3 seconds hold - Deadlift With Dumbbells  - 1 x daily - 7 x weekly - 3 sets - 10 reps      ASSESSMENT:   CLINICAL IMPRESSION: Upon re-assessment of objective measures, the pt has made some improvements in hip strength and pain levels with trunk flexion and Rt side bending. However, she continues to be limited functionally by lumbar pain. Due to a lack of progress in PT, she is discharged from PT at this time with an updated HEP and instructions to follow up with her referring provider if she continues to have high levels of lumbar pain.      OBJECTIVE IMPAIRMENTS decreased mobility, decreased ROM, decreased strength, hypomobility, impaired flexibility, improper body mechanics, postural dysfunction, and pain.    ACTIVITY LIMITATIONS cleaning, community activity, driving, occupation, Medical sales representative, yard work, and shopping.    PERSONAL FACTORS Time since onset of injury/illness/exacerbation are also affecting patient's functional outcome.        GOALS: Goals reviewed with patient? Yes   SHORT TERM GOALS: Target date: 03/30/2022       Pt will report understanding and adherence to her HEP in order to  promote independence in the management of her primary impairments. Baseline: HEP provided at eval 04/28/2022: Pt reports adherence to her HEP Goal status: ACHIEVED     LONG TERM GOALS: Target date: 04/27/2022    Pt will achieve a FOTO score of 64% in order to demonstrate improved functional ability as it relates to her lumbar impairments. Baseline: 56% 04/28/2022: 57% 06/16/2022: 56% Goal status: NOT MET   2.  Pt will achieve full BIL trunk side flexion AROM with 0-2/10 pain in order to bend over to pick up groceries with less limitation. Baseline: 4-5/10 pain 04/28/2022: 5/10 pain 06/16/2022: no pain with Rt trunk side flexion, 5/10p! On Lt Goal status: PARTIALLY MET   3.  Pt will achieve BIL hip strength of 5/5 in order to progress her independent LE strengthening regimen. Baseline: See MMT chart 04/28/2022: 5/5 in BIL extension and abduction, 4+/5 in BIL flexion 06/16/2022: 5.5 globally BIL Goal status: ACHIEVED   4.  Pt will achieve full trunk flexion AROM with 0-2/10 pain in order to get dressed with less limitation. Baseline: 6/10 pain 04/28/2022: 5/10 pain 06/16/2022: WNL with no pain Goal status: ACHIEVED       PLAN: PT FREQUENCY: 1x/week   PT DURATION: 8 weeks   PLANNED INTERVENTIONS: Therapeutic exercises, Therapeutic activity, Neuromuscular re-education, Balance training, Gait training, Patient/Family education, Joint manipulation, Joint mobilization, Stair training, DME instructions, Aquatic Therapy, Dry Needling, Electrical stimulation, Spinal manipulation, Spinal mobilization, Cryotherapy, Moist heat, Taping, Traction, Biofeedback, Ionotophoresis 57m/ml Dexamethasone, Manual therapy, and Re-evaluation.   PLAN FOR NEXT SESSION: Further hip assessment, progress core/ hip strengthening, lumbar mobility, consider manual techniques/ OMT/ TPDN for pain reduction  PHYSICAL THERAPY DISCHARGE SUMMARY  Visits from Start of Care: 10  Current functional level related to goals /  functional outcomes: Pt made  improvements in pain levels with trunk flexion and Rt side bending, as well as global hip strength   Remaining deficits: Pain with Lt side bending and trunk extension   Education / Equipment: HEP   Patient agrees to discharge. Patient goals were partially met. Patient is being discharged due to maximized rehab potential.    Vanessa [place], PT, DPT 06/16/22 5:40 PM

## 2022-06-27 DIAGNOSIS — D0472 Carcinoma in situ of skin of left lower limb, including hip: Secondary | ICD-10-CM | POA: Diagnosis not present

## 2022-06-27 DIAGNOSIS — L821 Other seborrheic keratosis: Secondary | ICD-10-CM | POA: Diagnosis not present

## 2022-06-27 DIAGNOSIS — Z85828 Personal history of other malignant neoplasm of skin: Secondary | ICD-10-CM | POA: Diagnosis not present

## 2022-06-27 DIAGNOSIS — D1801 Hemangioma of skin and subcutaneous tissue: Secondary | ICD-10-CM | POA: Diagnosis not present

## 2022-06-27 DIAGNOSIS — L812 Freckles: Secondary | ICD-10-CM | POA: Diagnosis not present

## 2022-06-27 DIAGNOSIS — L570 Actinic keratosis: Secondary | ICD-10-CM | POA: Diagnosis not present

## 2022-06-27 DIAGNOSIS — D485 Neoplasm of uncertain behavior of skin: Secondary | ICD-10-CM | POA: Diagnosis not present

## 2022-06-27 DIAGNOSIS — D225 Melanocytic nevi of trunk: Secondary | ICD-10-CM | POA: Diagnosis not present

## 2022-07-05 DIAGNOSIS — M5459 Other low back pain: Secondary | ICD-10-CM | POA: Diagnosis not present

## 2022-11-18 ENCOUNTER — Other Ambulatory Visit (HOSPITAL_COMMUNITY): Payer: Self-pay

## 2022-11-18 MED ORDER — METHYLPREDNISOLONE 4 MG PO TBPK
ORAL_TABLET | ORAL | 0 refills | Status: DC
  Filled 2022-11-18: qty 21, 6d supply, fill #0

## 2022-11-23 DIAGNOSIS — M419 Scoliosis, unspecified: Secondary | ICD-10-CM | POA: Diagnosis not present

## 2022-12-14 ENCOUNTER — Other Ambulatory Visit: Payer: Self-pay

## 2022-12-15 DIAGNOSIS — M419 Scoliosis, unspecified: Secondary | ICD-10-CM | POA: Diagnosis not present

## 2022-12-15 DIAGNOSIS — M47816 Spondylosis without myelopathy or radiculopathy, lumbar region: Secondary | ICD-10-CM | POA: Diagnosis not present

## 2022-12-19 ENCOUNTER — Other Ambulatory Visit (HOSPITAL_COMMUNITY): Payer: Self-pay

## 2022-12-19 MED ORDER — DICLOFENAC SODIUM 75 MG PO TBEC
75.0000 mg | DELAYED_RELEASE_TABLET | Freq: Two times a day (BID) | ORAL | 1 refills | Status: DC
  Filled 2022-12-19: qty 60, 30d supply, fill #0

## 2022-12-20 DIAGNOSIS — L905 Scar conditions and fibrosis of skin: Secondary | ICD-10-CM | POA: Diagnosis not present

## 2022-12-20 DIAGNOSIS — Z85828 Personal history of other malignant neoplasm of skin: Secondary | ICD-10-CM | POA: Diagnosis not present

## 2023-02-02 DIAGNOSIS — Z1231 Encounter for screening mammogram for malignant neoplasm of breast: Secondary | ICD-10-CM | POA: Diagnosis not present

## 2023-02-02 DIAGNOSIS — Z01419 Encounter for gynecological examination (general) (routine) without abnormal findings: Secondary | ICD-10-CM | POA: Diagnosis not present

## 2023-02-02 DIAGNOSIS — Z13820 Encounter for screening for osteoporosis: Secondary | ICD-10-CM | POA: Diagnosis not present

## 2023-02-02 DIAGNOSIS — E559 Vitamin D deficiency, unspecified: Secondary | ICD-10-CM | POA: Diagnosis not present

## 2023-02-02 DIAGNOSIS — Z681 Body mass index (BMI) 19 or less, adult: Secondary | ICD-10-CM | POA: Diagnosis not present

## 2023-02-02 LAB — HM MAMMOGRAPHY

## 2023-02-06 ENCOUNTER — Other Ambulatory Visit (HOSPITAL_COMMUNITY): Payer: Self-pay

## 2023-02-06 MED ORDER — CHOLECALCIFEROL 1.25 MG (50000 UT) PO CAPS
50000.0000 [IU] | ORAL_CAPSULE | ORAL | 0 refills | Status: DC
  Filled 2023-02-06: qty 12, 84d supply, fill #0

## 2023-02-22 ENCOUNTER — Ambulatory Visit: Payer: Commercial Managed Care - PPO | Admitting: Internal Medicine

## 2023-02-22 ENCOUNTER — Encounter: Payer: Self-pay | Admitting: Internal Medicine

## 2023-02-22 VITALS — BP 100/80 | HR 50 | Temp 98.5°F | Ht 65.0 in | Wt 111.0 lb

## 2023-02-22 DIAGNOSIS — Z0000 Encounter for general adult medical examination without abnormal findings: Secondary | ICD-10-CM | POA: Diagnosis not present

## 2023-02-22 DIAGNOSIS — M5416 Radiculopathy, lumbar region: Secondary | ICD-10-CM

## 2023-02-22 DIAGNOSIS — Z85828 Personal history of other malignant neoplasm of skin: Secondary | ICD-10-CM | POA: Diagnosis not present

## 2023-02-22 DIAGNOSIS — Z23 Encounter for immunization: Secondary | ICD-10-CM | POA: Diagnosis not present

## 2023-02-22 DIAGNOSIS — Z13220 Encounter for screening for lipoid disorders: Secondary | ICD-10-CM | POA: Diagnosis not present

## 2023-02-22 DIAGNOSIS — Z13828 Encounter for screening for other musculoskeletal disorder: Secondary | ICD-10-CM | POA: Insufficient documentation

## 2023-02-22 DIAGNOSIS — Z0001 Encounter for general adult medical examination with abnormal findings: Secondary | ICD-10-CM

## 2023-02-22 DIAGNOSIS — M5440 Lumbago with sciatica, unspecified side: Secondary | ICD-10-CM | POA: Insufficient documentation

## 2023-02-22 DIAGNOSIS — M5459 Other low back pain: Secondary | ICD-10-CM | POA: Insufficient documentation

## 2023-02-22 NOTE — Progress Notes (Signed)
   Subjective:   Patient ID: Joanne Booth, female    DOB: 1966/01/31, 57 y.o.   MRN: [id number]  HPI The patient is a new 57 YO female coming in for arthritis and desires physical.   PMH, FMH, social history reviewed and updated  Review of Systems  Constitutional: Negative.   HENT: Negative.    Eyes: Negative.   Respiratory:  Negative for cough, chest tightness and shortness of breath.   Cardiovascular:  Negative for chest pain, palpitations and leg swelling.  Gastrointestinal:  Negative for abdominal distention, abdominal pain, constipation, diarrhea, nausea and vomiting.  Musculoskeletal:  Positive for arthralgias and myalgias.  Skin: Negative.   Neurological: Negative.   Psychiatric/Behavioral: Negative.      Objective:  Physical Exam Constitutional:      Appearance: She is well-developed.  HENT:     Head: Normocephalic and atraumatic.  Cardiovascular:     Rate and Rhythm: Normal rate and regular rhythm.  Pulmonary:     Effort: Pulmonary effort is normal. No respiratory distress.     Breath sounds: Normal breath sounds. No wheezing or rales.  Abdominal:     General: Bowel sounds are normal. There is no distension.     Palpations: Abdomen is soft.     Tenderness: There is no abdominal tenderness. There is no rebound.  Musculoskeletal:        General: Tenderness present.     Cervical back: Normal range of motion.  Skin:    General: Skin is warm and dry.  Neurological:     Mental Status: She is alert and oriented to person, place, and time.     Coordination: Coordination normal.     Vitals:   02/22/23 1045  BP: 100/80  Pulse: (!) 50  Temp: 98.5 F (36.9 C)  TempSrc: Oral  SpO2: 98%  Weight: 111 lb (50.3 kg)  Height: 5\' 5"  (1.651 m)    Assessment & Plan:  Shingrix IM given at visit.

## 2023-02-22 NOTE — Patient Instructions (Addendum)
We will get you in with Dr. Katrinka Blazing at sports medicine to see if he can come up with a preventative plan.  Try adding turmeric over the counter to help.

## 2023-02-23 ENCOUNTER — Telehealth: Payer: Self-pay | Admitting: Internal Medicine

## 2023-02-23 NOTE — Telephone Encounter (Signed)
I called the patient to schedule an appointment with [name].  She asked me to let you know that she checked with [facility] and her last Tetanus shot was in 2007.  Just FYI.

## 2023-02-24 DIAGNOSIS — Z0001 Encounter for general adult medical examination with abnormal findings: Secondary | ICD-10-CM | POA: Insufficient documentation

## 2023-02-24 NOTE — Assessment & Plan Note (Signed)
Pain is intermittent but getting more common in years. Has done nerve ablation and did not get relief with this. Wants to see sports medicine to see if there are other options to help. Has tried PT and this did not help significantly as well.

## 2023-02-24 NOTE — Assessment & Plan Note (Signed)
Flu shot yearly. Shingrix given 1st. Tetanus unsure date she will check. Colonoscopy due at 60-61 getting records. Mammogram up to date with gyn getting records, pap smear up to date with gyn getting records and dexa up to date with gyn getting records. Counseled about sun safety and mole surveillance. Counseled about the dangers of distracted driving. Given 10 year screening recommendations.

## 2023-02-24 NOTE — Assessment & Plan Note (Signed)
With low back problems intermittent but more common in the last few year. Suspect osteoarthritis no features concerning for RA or psoriatic arthritis. Advised to start turmeric daily and referral to sports medicine.

## 2023-02-24 NOTE — Assessment & Plan Note (Signed)
Has multiple prior basal cell carcinoma and seeing derm regularly. Counseled on monitoring and sun prevention/protection.

## 2023-02-27 LAB — COMPREHENSIVE METABOLIC PANEL
ALT: 17 U/L (ref 0–35)
AST: 25 U/L (ref 0–37)
Albumin: 4.2 g/dL (ref 3.5–5.2)
Alkaline Phosphatase: 50 U/L (ref 39–117)
BUN: 20 mg/dL (ref 6–23)
CO2: 30 mEq/L (ref 19–32)
Calcium: 9.5 mg/dL (ref 8.4–10.5)
Chloride: 101 mEq/L (ref 96–112)
Creatinine, Ser: 0.89 mg/dL (ref 0.40–1.20)
GFR: 71.04 mL/min (ref >60.00)
Glucose, Bld: 94 mg/dL (ref 70–99)
Potassium: 4 mEq/L (ref 3.5–5.1)
Sodium: 137 mEq/L (ref 135–145)
Total Bilirubin: 0.3 mg/dL (ref 0.2–1.2)
Total Protein: 6.6 g/dL (ref 6.0–8.3)

## 2023-02-27 LAB — LIPID PANEL
Cholesterol: 244 mg/dL — ABNORMAL HIGH (ref 0–200)
HDL: 75.1 mg/dL (ref >39.00)
LDL Cholesterol: 134 mg/dL — ABNORMAL HIGH (ref 0–99)
NonHDL: 168.87
Total CHOL/HDL Ratio: 3
Triglycerides: 174 mg/dL — ABNORMAL HIGH (ref 0.0–149.0)
VLDL: 34.8 mg/dL (ref 0.0–40.0)

## 2023-02-27 LAB — CBC
HCT: 36.9 % (ref 36.0–46.0)
Hemoglobin: 12.4 g/dL (ref 12.0–15.0)
MCHC: 33.5 g/dL (ref 30.0–36.0)
MCV: 93.4 fl (ref 78.0–100.0)
Platelets: 268 10*3/uL (ref 150.0–400.0)
RBC: 3.95 Mil/uL (ref 3.87–5.11)
RDW: 13.2 % (ref 11.5–15.5)
WBC: 6.3 10*3/uL (ref 4.0–10.5)

## 2023-03-03 ENCOUNTER — Other Ambulatory Visit (HOSPITAL_COMMUNITY): Payer: Self-pay

## 2023-03-03 MED ORDER — MELOXICAM 15 MG PO TABS
15.0000 mg | ORAL_TABLET | Freq: Every day | ORAL | 0 refills | Status: DC
  Filled 2023-03-03: qty 30, 30d supply, fill #0

## 2023-03-20 NOTE — Progress Notes (Unsigned)
[name] [address] [place] [address] Phone: [phone] Subjective:   INadine Counts, am serving as a scribe for Dr. Antoine Primas.  I'm seeing this patient by the request  of:  Myrlene Broker, MD  CC: back pain   [id number]  Joanne Booth is a 56 y.o. female coming in with complaint of back pain. Started about 2 years ago. No injuries. Pain is on lower left side, but recently has been on the entire left side. Pain is achy, constant, and tight. Gets worse towards end of day. Nothing sharp or radiating down leg. No numbness or tingling. Uses massage gun and that helps for a little while.  Patient has been seen by neurosurgery and going to physical therapy on a very regular basis.  Last x-rays I have is from metabolic 20 patient does have an L2 on 3 and L3 on 4 anterolisthesis with dextroscoliosis of the lumbar spine. Patient has had nerve ablation but did not make any significant improvement as well.  Physical therapy did not help either. Past Medical History:  Diagnosis Date   Basal cell carcinoma    Past Surgical History:  Procedure Laterality Date   basal cell skin cancer removals     Social History   Socioeconomic History   Marital status: Married    Spouse name: Not on file   Number of children: 3   Years of education: Not on file   Highest education level: Not on file  Occupational History   Not on file  Tobacco Use   Smoking status: Never   Smokeless tobacco: Never  Vaping Use   Vaping Use: Never used  Substance and Sexual Activity   Alcohol use: Yes    Alcohol/week: 0.0 standard drinks of alcohol    Comment: occasionally    Drug use: Never   Sexual activity: Yes  Other Topics Concern   Not on file  Social History Narrative   Lives home with husband and daughter   Right handed   Caffeine: 1 cup/day   Social Determinants of Health   Financial Resource Strain: Not on file  Food Insecurity: Not on  file  Transportation Needs: Not on file  Physical Activity: Not on file  Stress: Not on file  Social Connections: Not on file   No Known Allergies Family History  Problem Relation Age of Onset   Breast cancer Mother    Osteoarthritis Mother    Osteoarthritis Other    Migraines Son        started when he was a pre-teen, aura, migraine, "full blown"       Current Outpatient Medications (Analgesics):    diclofenac (VOLTAREN) 75 MG EC tablet, Take 1 tablet (75 mg total) by mouth 2 (two) times daily.   meloxicam (MOBIC) 15 MG tablet, Take 1 tablet (15 mg total) by mouth daily.   Current Outpatient Medications (Other):    Cholecalciferol 1.25 MG (50000 UT) capsule, Take 1 capsule (50,000 Units total) by mouth once a week.   Reviewed prior external information including notes and imaging from  primary care provider As well as notes that were available from care everywhere and other healthcare systems.  Past medical history, social, surgical and family history all reviewed in electronic medical record.  No pertanent information unless stated regarding to the chief complaint.   Review of Systems:  No headache, visual changes, nausea, vomiting, diarrhea, constipation, dizziness, abdominal pain, skin rash, fevers, chills, night sweats,  weight loss, swollen lymph nodes, body aches, joint swelling, chest pain, shortness of breath, mood changes. POSITIVE muscle aches  Objective  Blood pressure 120/70, pulse 65, height 5\' 5"  (1.651 m), weight 115 lb (52.2 kg), last menstrual period 09/18/2013, SpO2 98 %.   General: No apparent distress alert and oriented x3 mood and affect normal, dressed appropriately.  HEENT: Pupils equal, extraocular movements intact  Respiratory: Patient's speak in full sentences and does not appear short of breath  Cardiovascular: No lower extremity edema, non tender, no erythema  Low back exam shows patient does have dextroscoliosis noted of the lumbar spine.   Patient does have some rotation of the hips to compensate for this as well.  Weakness noted in the hip abductors were 3+ out of 5 strength at right weaker than left.  Significant tightness with FABER test bilaterally.   [id number]; 15 additional minutes spent for Therapeutic exercises as stated in above notes.  This included exercises focusing on stretching, strengthening, with significant focus on eccentric aspects.   Long term goals include an improvement in range of motion, strength, endurance as well as avoiding reinjury. Patient's frequency would include in 1-2 times a day, 3-5 times a week for a duration of 6-12 weeks.Hip strengthening exercises which included:  Pelvic tilt/bracing to help with proper recruitment of the lower abs and pelvic floor muscles  Glute strengthening to properly contract glutes without over-engaging low back and hamstrings - prone hip extension and glute bridge exercises Proper stretching techniques to increase effectiveness for the hip flexors, groin, quads, piriformic and low back when appropriate    Proper technique shown and discussed handout in great detail with ATC.  All questions were discussed and answered.     Impression and Recommendations:    The above documentation has been reviewed and is accurate and complete Judi Saa, DO

## 2023-03-21 ENCOUNTER — Ambulatory Visit: Payer: Commercial Managed Care - PPO | Admitting: Family Medicine

## 2023-03-21 ENCOUNTER — Encounter: Payer: Self-pay | Admitting: Family Medicine

## 2023-03-21 VITALS — BP 120/70 | HR 65 | Ht 65.0 in | Wt 115.0 lb

## 2023-03-21 DIAGNOSIS — Z13828 Encounter for screening for other musculoskeletal disorder: Secondary | ICD-10-CM

## 2023-03-21 NOTE — Assessment & Plan Note (Signed)
Patient does have scoliosis noted of the back.  Seems to be dextroscoliosis of the lumbar spine.  I do not think that this is the main thing that is contributing to her discomfort and pain at the moment.  Will try to get the CD so we can look at the imaging independently for ourselves.  Given home exercises and work with Event organiser.  Happy to see how patient responds to this.  No significant radicular symptoms.  Could be a potential candidate for osteopathic manipulation.  Patient will start with the schedule changes and see how patient responds.  Follow-up with me again in 6 to 8 weeks also consider formal physical therapy as well as laboratory workup if necessary.

## 2023-03-21 NOTE — Patient Instructions (Signed)
Do prescribed exercises at least 3x a week Tart Cherry 1200mg  See you again in 6-8 weeks

## 2023-03-22 ENCOUNTER — Other Ambulatory Visit (HOSPITAL_COMMUNITY): Payer: Self-pay

## 2023-03-23 ENCOUNTER — Other Ambulatory Visit (HOSPITAL_COMMUNITY): Payer: Self-pay

## 2023-05-16 ENCOUNTER — Ambulatory Visit: Payer: Commercial Managed Care - PPO | Admitting: Family Medicine

## 2023-05-18 NOTE — Progress Notes (Signed)
[name] [address] [place] [address] Phone: [phone] Subjective:   Joanne Booth, am serving as a scribe for Dr. Antoine Primas.  I'm seeing this patient by the request  of:  Myrlene Broker, MD  CC: Back pain follow-up  [id number]  03/21/2023 Patient does have scoliosis noted of the back. Seems to be dextroscoliosis of the lumbar spine. I do not think that this is the main thing that is contributing to her discomfort and pain at the moment. Will try to get the CD so we can look at the imaging independently for ourselves. Given home exercises and work with Event organiser. Happy to see how patient responds to this. No significant radicular symptoms. Could be a potential candidate for osteopathic manipulation. Patient will start with the schedule changes and see how patient responds. Follow-up with me again in 6 to 8 weeks also consider formal physical therapy as well as laboratory workup if necessary.   Updated 05/24/2023 Joanne Booth is a 57 y.o. female coming in with complaint of lumbar spine pain. Patient states that her pain is in lumbar spine is the same as last visit but pain is now in thoracic spine as well on L side. Pain is constant. Taking tart cherry extract at night. Meloxicam was somewhat helpful for hip pain but it does not help with her back pain.        Past Medical History:  Diagnosis Date   Basal cell carcinoma    Past Surgical History:  Procedure Laterality Date   basal cell skin cancer removals     Social History   Socioeconomic History   Marital status: Married    Spouse name: Not on file   Number of children: 3   Years of education: Not on file   Highest education level: Not on file  Occupational History   Not on file  Tobacco Use   Smoking status: Never   Smokeless tobacco: Never  Vaping Use   Vaping status: Never Used  Substance and Sexual Activity   Alcohol use: Yes    Alcohol/week:  0.0 standard drinks of alcohol    Comment: occasionally    Drug use: Never   Sexual activity: Yes  Other Topics Concern   Not on file  Social History Narrative   Lives home with husband and daughter   Right handed   Caffeine: 1 cup/day   Social Determinants of Health   Financial Resource Strain: Not on file  Food Insecurity: Not on file  Transportation Needs: Not on file  Physical Activity: Not on file  Stress: Not on file  Social Connections: Unknown (02/25/2022)   Received from [facility], [facility]   Social Network    Social Network: Not on file   No Known Allergies Family History  Problem Relation Age of Onset   Breast cancer Mother    Osteoarthritis Mother    Osteoarthritis Other    Migraines Son        started when he was a pre-teen, aura, migraine, "full blown"       Current Outpatient Medications (Analgesics):    diclofenac (VOLTAREN) 75 MG EC tablet, Take 1 tablet (75 mg total) by mouth 2 (two) times daily.   meloxicam (MOBIC) 15 MG tablet, Take 1 tablet (15 mg total) by mouth daily.   Current Outpatient Medications (Other):    Cholecalciferol 1.25 MG (50000 UT) capsule, Take 1 capsule (50,000 Units total) by mouth once a  week.   Reviewed prior external information including notes and imaging from  primary care provider As well as notes that were available from care everywhere and other healthcare systems.  Past medical history, social, surgical and family history all reviewed in electronic medical record.  No pertanent information unless stated regarding to the chief complaint.   Review of Systems:  No headache, visual changes, nausea, vomiting, diarrhea, constipation, dizziness, abdominal pain, skin rash, fevers, chills, night sweats, weight loss, swollen lymph nodes, body aches, joint swelling, chest pain, shortness of breath, mood changes. POSITIVE muscle aches  Objective  Blood pressure 98/74, pulse (!) 57, height 5\' 5"  (1.651 m), weight 113  lb (51.3 kg), last menstrual period 09/18/2013, SpO2 98%.   General: No apparent distress alert and oriented x3 mood and affect normal, dressed appropriately.  HEENT: Pupils equal, extraocular movements intact  Respiratory: Patient's speak in full sentences and does not appear short of breath  Cardiovascular: No lower extremity edema, non tender, no erythema  Low back does have some loss lordosis noted.  Some tenderness to palpation of the paraspinal musculature.  Seems to be more right in the thoracolumbar juncture.  Patient does have tightness with FABER test left greater than right as well.   Osteopathic findings  T3 extended rotated and side bent right inhaled third rib T8 extended rotated and side bent left L3 flexed rotated and side bent right Sacrum right on right    Impression and Recommendations:     The above documentation has been reviewed and is accurate and complete Judi Saa, DO

## 2023-05-24 ENCOUNTER — Encounter: Payer: Self-pay | Admitting: Family Medicine

## 2023-05-24 ENCOUNTER — Ambulatory Visit (INDEPENDENT_AMBULATORY_CARE_PROVIDER_SITE_OTHER): Payer: Commercial Managed Care - PPO | Admitting: Family Medicine

## 2023-05-24 VITALS — BP 98/74 | HR 57 | Ht 65.0 in | Wt 113.0 lb

## 2023-05-24 DIAGNOSIS — M9902 Segmental and somatic dysfunction of thoracic region: Secondary | ICD-10-CM

## 2023-05-24 DIAGNOSIS — M9903 Segmental and somatic dysfunction of lumbar region: Secondary | ICD-10-CM | POA: Diagnosis not present

## 2023-05-24 DIAGNOSIS — L570 Actinic keratosis: Secondary | ICD-10-CM | POA: Diagnosis not present

## 2023-05-24 DIAGNOSIS — M5416 Radiculopathy, lumbar region: Secondary | ICD-10-CM

## 2023-05-24 DIAGNOSIS — M9904 Segmental and somatic dysfunction of sacral region: Secondary | ICD-10-CM | POA: Diagnosis not present

## 2023-05-24 DIAGNOSIS — Z85828 Personal history of other malignant neoplasm of skin: Secondary | ICD-10-CM | POA: Diagnosis not present

## 2023-05-24 DIAGNOSIS — L820 Inflamed seborrheic keratosis: Secondary | ICD-10-CM | POA: Diagnosis not present

## 2023-05-24 NOTE — Assessment & Plan Note (Signed)

## 2023-05-24 NOTE — Assessment & Plan Note (Addendum)
Patient continues to have some intermittent radicular symptoms it appears as well as the bilateral hip pain.  And seems to be more tightness than anything else.  We discussed icing regimen and home exercises.  Discussed which activities to do and which ones to avoid.  Increase activity slowly.  Follow-up again in 6 to 8 weeks discussed if worsening symptoms will consider the possibility of advanced imaging again.

## 2023-05-24 NOTE — Patient Instructions (Signed)
Tried manipulation Ice 20 min Yoga wheel See me in 6-8 weeks

## 2023-05-29 ENCOUNTER — Ambulatory Visit (INDEPENDENT_AMBULATORY_CARE_PROVIDER_SITE_OTHER): Payer: Commercial Managed Care - PPO

## 2023-05-29 DIAGNOSIS — E559 Vitamin D deficiency, unspecified: Secondary | ICD-10-CM | POA: Diagnosis not present

## 2023-05-29 DIAGNOSIS — Z23 Encounter for immunization: Secondary | ICD-10-CM

## 2023-05-29 NOTE — Progress Notes (Signed)
Pt received 2nd shingles vaccine w/o any complications.

## 2023-06-13 DIAGNOSIS — M419 Scoliosis, unspecified: Secondary | ICD-10-CM | POA: Diagnosis not present

## 2023-06-13 DIAGNOSIS — M47816 Spondylosis without myelopathy or radiculopathy, lumbar region: Secondary | ICD-10-CM | POA: Diagnosis not present

## 2023-06-14 ENCOUNTER — Other Ambulatory Visit: Payer: Self-pay | Admitting: *Deleted

## 2023-06-14 DIAGNOSIS — M419 Scoliosis, unspecified: Secondary | ICD-10-CM

## 2023-06-14 DIAGNOSIS — M47816 Spondylosis without myelopathy or radiculopathy, lumbar region: Secondary | ICD-10-CM

## 2023-06-21 ENCOUNTER — Encounter: Payer: Self-pay | Admitting: *Deleted

## 2023-06-25 ENCOUNTER — Ambulatory Visit
Admission: RE | Admit: 2023-06-25 | Discharge: 2023-06-25 | Disposition: A | Discharge status: Home / Self Care | Payer: Commercial Managed Care - PPO | Source: Ambulatory Visit | Attending: *Deleted | Admitting: *Deleted

## 2023-06-25 DIAGNOSIS — M47816 Spondylosis without myelopathy or radiculopathy, lumbar region: Secondary | ICD-10-CM

## 2023-06-25 DIAGNOSIS — M419 Scoliosis, unspecified: Secondary | ICD-10-CM

## 2023-06-25 DIAGNOSIS — M5126 Other intervertebral disc displacement, lumbar region: Secondary | ICD-10-CM | POA: Diagnosis not present

## 2023-07-07 ENCOUNTER — Ambulatory Visit: Payer: Commercial Managed Care - PPO | Admitting: Family Medicine

## 2023-07-18 NOTE — Progress Notes (Unsigned)
[name] [address] [place] [address] Phone: [phone] Subjective:   [name], am serving as a scribe for Dr. Antoine Primas.  I'm seeing this patient by the request  of:  Myrlene Broker, MD  CC: Back and neck pain  [id number]  Joanne Booth is a 57 y.o. female coming in with complaint of back and neck pain. OMT on 05/24/2023. Patient states that she continues to have constant, achy pain in the left side of lower back. Pain seems to be getting worse. Denies any radiating symptoms.   Medications patient has been prescribed:   Taking: Since we have seen patient patient did have an MRI.  This was independently visualized by me.  Multiple levels of mild degenerative disc disease noted in the lumbar spine spurring of the facet joints noted at L4-L5.  Relatively stable from 2022.        Reviewed prior external information including notes and imaging from previsou exam, outside providers and external EMR if available.   As well as notes that were available from care everywhere and other healthcare systems.  Past medical history, social, surgical and family history all reviewed in electronic medical record.  No pertanent information unless stated regarding to the chief complaint.   Past Medical History:  Diagnosis Date   Basal cell carcinoma     No Known Allergies   Review of Systems:  No headache, visual changes, nausea, vomiting, diarrhea, constipation, dizziness, abdominal pain, skin rash, fevers, chills, night sweats, weight loss, swollen lymph nodes, body aches, joint swelling, chest pain, shortness of breath, mood changes. POSITIVE muscle aches  Objective  Blood pressure 110/74, pulse 67, height 5\' 5"  (1.651 m), weight 113 lb (51.3 kg), last menstrual period 09/18/2013, SpO2 96%.   General: No apparent distress alert and oriented x3 mood and affect normal, dressed appropriately.  HEENT: Pupils equal,  extraocular movements intact  Respiratory: Patient's speak in full sentences and does not appear short of breath  Cardiovascular: No lower extremity edema, non tender, no erythema   MSK:  Back: Significant tightness noted of the back.  Tenderness to palpation especially in the thoracolumbar juncture.  Loss of lordosis noted.  Multiple trigger points noted in the lumbar spine mostly in the spine Alice muscle.  Osteopathic findings  C2 flexed rotated and side bent right C6 flexed rotated and side bent left T3 extended rotated and side bent right inhaled rib T12 extended rotated and side bent left L1 flexed rotated and side bent right Sacrum right on right   With a 25-gauge half inch needle injected with 0.5 cc of 0.5% Marcaine and 1 cc of Kenalog 40 mg/mL into 3 distinct trigger points in the lumbar region.  Minimal blood loss.  Band-Aid placed.  Postinjection instructions given    Assessment and Plan:  Lumbar radiculopathy Discussed which activities to do and which ones to avoid.  Increase activity slowly.  Discussed which activities to do and which ones to avoid. Discussed with patient that we can consider the potential facet injections as well.  Discussed which activities to do and which ones to avoid.  Follow-up again in 6 to 8 weeks Discussed with patient about different medications including Effexor and Cymbalta which I think would be beneficial and patient will look into these.  Lumbar trigger point syndrome Patient given injections and tolerated the procedure well, discussed icing regimen and home exercises, discussed which activities to do and which ones to avoid.  Increase activity slowly.  Follow-up again in 6 to 8 weeks otherwise.    Nonallopathic problems  Decision today to treat with OMT was based on Physical Exam  After verbal consent patient was treated with HVLA, ME, FPR techniques in cervical, rib, thoracic, lumbar, and sacral  areas  Patient tolerated the  procedure well with improvement in symptoms  Patient given exercises, stretches and lifestyle modifications  See medications in patient instructions if given  Patient will follow up in 4-8 weeks     The above documentation has been reviewed and is accurate and complete Judi Saa, DO         Note: This dictation was prepared with Dragon dictation along with smaller phrase technology. Any transcriptional errors that result from this process are unintentional.

## 2023-07-19 ENCOUNTER — Encounter: Payer: Self-pay | Admitting: Family Medicine

## 2023-07-19 ENCOUNTER — Ambulatory Visit (INDEPENDENT_AMBULATORY_CARE_PROVIDER_SITE_OTHER): Payer: Commercial Managed Care - PPO | Admitting: Family Medicine

## 2023-07-19 ENCOUNTER — Other Ambulatory Visit (HOSPITAL_COMMUNITY): Payer: Self-pay

## 2023-07-19 VITALS — BP 110/74 | HR 67 | Ht 65.0 in | Wt 113.0 lb

## 2023-07-19 DIAGNOSIS — M9908 Segmental and somatic dysfunction of rib cage: Secondary | ICD-10-CM | POA: Diagnosis not present

## 2023-07-19 DIAGNOSIS — M9901 Segmental and somatic dysfunction of cervical region: Secondary | ICD-10-CM

## 2023-07-19 DIAGNOSIS — M9902 Segmental and somatic dysfunction of thoracic region: Secondary | ICD-10-CM

## 2023-07-19 DIAGNOSIS — M5459 Other low back pain: Secondary | ICD-10-CM

## 2023-07-19 DIAGNOSIS — M9904 Segmental and somatic dysfunction of sacral region: Secondary | ICD-10-CM

## 2023-07-19 DIAGNOSIS — M5416 Radiculopathy, lumbar region: Secondary | ICD-10-CM | POA: Diagnosis not present

## 2023-07-19 DIAGNOSIS — M9903 Segmental and somatic dysfunction of lumbar region: Secondary | ICD-10-CM | POA: Diagnosis not present

## 2023-07-19 MED ORDER — MELOXICAM 15 MG PO TABS
15.0000 mg | ORAL_TABLET | Freq: Every day | ORAL | 0 refills | Status: DC
  Filled 2023-07-19: qty 30, 30d supply, fill #0

## 2023-07-19 NOTE — Assessment & Plan Note (Signed)
Patient given injections and tolerated the procedure well, discussed icing regimen and home exercises, discussed which activities to do and which ones to avoid.  Increase activity slowly.  Follow-up again in 6 to 8 weeks otherwise.

## 2023-07-19 NOTE — Patient Instructions (Addendum)
Trigger point injections today Review Effexor Consider MRI of pelvis See you again in 6-8 weeks

## 2023-07-19 NOTE — Assessment & Plan Note (Signed)
Discussed which activities to do and which ones to avoid.  Increase activity slowly.  Discussed which activities to do and which ones to avoid. Discussed with patient that we can consider the potential facet injections as well.  Discussed which activities to do and which ones to avoid.  Follow-up again in 6 to 8 weeks Discussed with patient about different medications including Effexor and Cymbalta which I think would be beneficial and patient will look into these.

## 2023-07-27 DIAGNOSIS — D2272 Melanocytic nevi of left lower limb, including hip: Secondary | ICD-10-CM | POA: Diagnosis not present

## 2023-07-27 DIAGNOSIS — C44619 Basal cell carcinoma of skin of left upper limb, including shoulder: Secondary | ICD-10-CM | POA: Diagnosis not present

## 2023-07-27 DIAGNOSIS — D485 Neoplasm of uncertain behavior of skin: Secondary | ICD-10-CM | POA: Diagnosis not present

## 2023-07-27 DIAGNOSIS — M419 Scoliosis, unspecified: Secondary | ICD-10-CM | POA: Diagnosis not present

## 2023-07-27 DIAGNOSIS — M47816 Spondylosis without myelopathy or radiculopathy, lumbar region: Secondary | ICD-10-CM | POA: Diagnosis not present

## 2023-07-27 DIAGNOSIS — D2261 Melanocytic nevi of right upper limb, including shoulder: Secondary | ICD-10-CM | POA: Diagnosis not present

## 2023-07-27 DIAGNOSIS — Z85828 Personal history of other malignant neoplasm of skin: Secondary | ICD-10-CM | POA: Diagnosis not present

## 2023-07-27 DIAGNOSIS — L853 Xerosis cutis: Secondary | ICD-10-CM | POA: Diagnosis not present

## 2023-07-27 DIAGNOSIS — D225 Melanocytic nevi of trunk: Secondary | ICD-10-CM | POA: Diagnosis not present

## 2023-07-27 DIAGNOSIS — L812 Freckles: Secondary | ICD-10-CM | POA: Diagnosis not present

## 2023-07-27 DIAGNOSIS — L821 Other seborrheic keratosis: Secondary | ICD-10-CM | POA: Diagnosis not present

## 2023-08-21 DIAGNOSIS — M47816 Spondylosis without myelopathy or radiculopathy, lumbar region: Secondary | ICD-10-CM | POA: Diagnosis not present

## 2023-08-29 ENCOUNTER — Other Ambulatory Visit: Payer: Self-pay | Admitting: Family Medicine

## 2023-08-29 ENCOUNTER — Other Ambulatory Visit (HOSPITAL_COMMUNITY): Payer: Self-pay

## 2023-08-29 MED ORDER — MELOXICAM 15 MG PO TABS
15.0000 mg | ORAL_TABLET | Freq: Every day | ORAL | 0 refills | Status: DC
  Filled 2023-08-29: qty 30, 30d supply, fill #0

## 2023-08-30 NOTE — Progress Notes (Deleted)
  [name] [address] [place] [address] Phone: [phone] Subjective:    I'm seeing this patient by the request  of:  Myrlene Broker, MD  CC: back and neck pain f/u   [id number]  Joanne Booth is a 57 y.o. female coming in with complaint of back and neck pain. OMT on 07/19/2023. 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 Relatively stable from 2022.      As well as notes that were available from care everywhere and other healthcare systems.  Past medical history, social, surgical and family history all reviewed in electronic medical record.  No pertanent information unless stated regarding to the chief complaint.   Past Medical History:  Diagnosis Date   Basal cell carcinoma     No Known Allergies   Review of Systems:  No headache, visual changes, nausea, vomiting, diarrhea, constipation, dizziness, abdominal pain, skin rash, fevers, chills, night sweats, weight loss, swollen lymph nodes, body aches, joint swelling, chest pain, shortness of breath, mood changes. POSITIVE muscle aches  Objective  Last menstrual period 09/18/2013.   General: No apparent distress alert and oriented x3 mood and affect normal, dressed appropriately.  HEENT: Pupils equal, extraocular movements intact  Respiratory: Patient's speak in full sentences and does not appear short of breath  Cardiovascular: No lower extremity edema, non tender, no erythema  Gait MSK:  Back   Osteopathic findings  C2 flexed rotated and side bent right C6 flexed rotated and side bent left T3 extended rotated and side bent right inhaled rib T9 extended rotated and side bent left L2 flexed rotated and side bent right Sacrum  right on right       Assessment and Plan:  No problem-specific Assessment & Plan notes found for this encounter.    Nonallopathic problems  Decision today to treat with OMT was based on Physical Exam  After verbal consent patient was treated with HVLA, ME, FPR techniques in cervical, rib, thoracic, lumbar, and sacral  areas  Patient tolerated the procedure well with improvement in symptoms  Patient given exercises, stretches and lifestyle modifications  See medications in patient instructions if given  Patient will follow up in 4-8 weeks     The above documentation has been reviewed and is accurate and complete Judi Saa, DO         Note: This dictation was prepared with Dragon dictation along with smaller phrase technology. Any transcriptional errors that result from this process are unintentional.

## 2023-09-04 ENCOUNTER — Ambulatory Visit: Payer: Commercial Managed Care - PPO | Admitting: Family Medicine

## 2023-11-01 ENCOUNTER — Ambulatory Visit: Payer: Commercial Managed Care - PPO | Admitting: Family Medicine

## 2023-11-03 NOTE — Progress Notes (Unsigned)
  [name] [address] [place] [address] Phone: [phone] Subjective:   [name], am serving as a scribe for Dr. Antoine Primas.  I'm seeing this patient by the request  of:  Myrlene Broker, MD  CC: Low back pain  [id number]  ISAMAR LOPEZHERNANDEZ is a 58 y.o. female coming in with complaint of back and neck pain. OMT on 07/19/2023. Patient states that she continues to have pain in L side of lumbar spine. Flexion increases pain. Feels a pinching sensation. Uses meloxicam prn.  Pain is affecting daily activities, now making her difficult.  Tightness noted.  Waking her up at night.  Medications patient has been prescribed: mobic  Taking:         Reviewed prior external information including notes and imaging from previsou exam, outside providers and external EMR if available.   As well as notes that were available from care everywhere and other healthcare systems.  Past medical history, social, surgical and family history all reviewed in electronic medical record.  No pertanent information unless stated regarding to the chief complaint.   Past Medical History:  Diagnosis Date   Basal cell carcinoma     No Known Allergies   Review of Systems:  No headache, visual changes, nausea, vomiting, diarrhea, constipation, dizziness, abdominal pain, skin rash, fevers, chills, night sweats, weight loss, swollen lymph nodes,, joint swelling, chest pain, shortness of breath, mood changes. POSITIVE muscle aches, body aches, fatigue  Objective  Blood pressure 110/64, pulse (!) 58, height 5\' 5"  (1.651 m), last menstrual period 09/18/2013, SpO2 97%.   General: No apparent distress alert and oriented x3 mood and affect normal, dressed appropriately.  HEENT: Pupils equal, extraocular movements intact  Respiratory: Patient's speak in full sentences and does not appear short of breath  Cardiovascular: No lower extremity edema, non tender,  no erythema  Low back does have some loss lordosis noted.  Some tenderness to palpation in the paraspinal musculature.  Patient does have audible popping noted especially with extension.  This seems to be mostly in the soft tissue.  This seems to be significantly worse than what it has been previously.       Assessment and Plan:  Lumbar radiculopathy Patient unfortunately continues to have worsening pain at the moment.  Severe enough that it is stopping her from activity.  Patient states they can wake her up at night.  Patient is a been unable to gain weight.  No worsening abdominal pain but unfortunately does not seem like she is having regular bowel movements though either.  Patient has these audible popping in the soft tissue in the paraspinal musculature of the lumbar spine.  Significantly louder than what would be anticipated.  No masses appreciated.  Do feel with patient failing all conservative therapy including multiple different injections including medial branch block, radiofrequency ablation, facet injections and not making any significant improvement do feel that further imaging of the abdomen and pelvis is necessary to further evaluate for any other abnormalities that could be potentially contributing.  Follow-up again 6 to 8 weeks.       The above documentation has been reviewed and is accurate and complete Judi Saa, DO          Note: This dictation was prepared with Dragon dictation along with smaller phrase technology. Any transcriptional errors that result from this process are unintentional.

## 2023-11-08 ENCOUNTER — Ambulatory Visit: Payer: Commercial Managed Care - PPO | Admitting: Family Medicine

## 2023-11-08 ENCOUNTER — Encounter: Payer: Self-pay | Admitting: Family Medicine

## 2023-11-08 VITALS — BP 110/64 | HR 58 | Ht 65.0 in

## 2023-11-08 DIAGNOSIS — R1084 Generalized abdominal pain: Secondary | ICD-10-CM | POA: Diagnosis not present

## 2023-11-08 DIAGNOSIS — M5416 Radiculopathy, lumbar region: Secondary | ICD-10-CM | POA: Diagnosis not present

## 2023-11-08 DIAGNOSIS — M2550 Pain in unspecified joint: Secondary | ICD-10-CM | POA: Diagnosis not present

## 2023-11-08 NOTE — Assessment & Plan Note (Addendum)
Patient unfortunately continues to have worsening pain at the moment.  Severe enough that it is stopping her from activity.  Patient states they can wake her up at night.  Patient is a been unable to gain weight.  No worsening abdominal pain but unfortunately does not seem like she is having regular bowel movements though either.  Patient has these audible popping in the soft tissue in the paraspinal musculature of the lumbar spine.  Significantly louder than what would be anticipated.  No masses appreciated.  Do feel with patient failing all conservative therapy including multiple different injections including medial branch block, radiofrequency ablation, facet injections and not making any significant improvement do feel that further imaging of the abdomen and pelvis is necessary to further evaluate for any other abnormalities that could be potentially contributing.  Pain is continuing to give significant difficulty and has been going on for multiple years.  Will get laboratory workup as well to make sure nothing else is abnormal at this moment.

## 2023-11-08 NOTE — Patient Instructions (Signed)
Stay active  We will order MRI abdomen and pelvis Labs today as well  We will write you and discuss next steps

## 2023-11-09 ENCOUNTER — Other Ambulatory Visit: Payer: Commercial Managed Care - PPO

## 2023-11-09 DIAGNOSIS — M2550 Pain in unspecified joint: Secondary | ICD-10-CM | POA: Diagnosis not present

## 2023-11-09 LAB — VITAMIN B12: Vitamin B-12: 105 pg/mL — ABNORMAL LOW (ref 211–911)

## 2023-11-09 LAB — COMPREHENSIVE METABOLIC PANEL
ALT: 15 U/L (ref 0–35)
AST: 17 U/L (ref 0–37)
Albumin: 4.4 g/dL (ref 3.5–5.2)
Alkaline Phosphatase: 48 U/L (ref 39–117)
BUN: 17 mg/dL (ref 6–23)
CO2: 28 meq/L (ref 19–32)
Calcium: 9.2 mg/dL (ref 8.4–10.5)
Chloride: 101 meq/L (ref 96–112)
Creatinine, Ser: 0.95 mg/dL (ref 0.40–1.20)
GFR: 65.37 mL/min (ref >60.00)
Glucose, Bld: 86 mg/dL (ref 70–99)
Potassium: 4.2 meq/L (ref 3.5–5.1)
Sodium: 136 meq/L (ref 135–145)
Total Bilirubin: 0.4 mg/dL (ref 0.2–1.2)
Total Protein: 6.4 g/dL (ref 6.0–8.3)

## 2023-11-09 LAB — CBC WITH DIFFERENTIAL/PLATELET
Basophils Absolute: 0 10*3/uL (ref 0.0–0.1)
Basophils Relative: 0.6 % (ref 0.0–3.0)
Eosinophils Absolute: 0.1 10*3/uL (ref 0.0–0.7)
Eosinophils Relative: 1.9 % (ref 0.0–5.0)
HCT: 37.4 % (ref 36.0–46.0)
Hemoglobin: 12.3 g/dL (ref 12.0–15.0)
Lymphocytes Relative: 24 % (ref 12.0–46.0)
Lymphs Abs: 1.5 10*3/uL (ref 0.7–4.0)
MCHC: 33 g/dL (ref 30.0–36.0)
MCV: 93.1 fL (ref 78.0–100.0)
Monocytes Absolute: 0.5 10*3/uL (ref 0.1–1.0)
Monocytes Relative: 8.2 % (ref 3.0–12.0)
Neutro Abs: 4.1 10*3/uL (ref 1.4–7.7)
Neutrophils Relative %: 65.3 % (ref 43.0–77.0)
Platelets: 263 10*3/uL (ref 150.0–400.0)
RBC: 4.01 Mil/uL (ref 3.87–5.11)
RDW: 12.9 % (ref 11.5–15.5)
WBC: 6.3 10*3/uL (ref 4.0–10.5)

## 2023-11-09 LAB — TSH: TSH: 1.73 u[IU]/mL (ref 0.35–5.50)

## 2023-11-09 LAB — IBC PANEL
Iron: 65 ug/dL (ref 42–145)
Saturation Ratios: 21.3 % (ref 20.0–50.0)
TIBC: 305.2 ug/dL (ref 250.0–450.0)
Transferrin: 218 mg/dL (ref 212.0–360.0)

## 2023-11-09 LAB — FERRITIN: Ferritin: 49 ng/mL (ref 10.0–291.0)

## 2023-11-09 LAB — VITAMIN D 25 HYDROXY (VIT D DEFICIENCY, FRACTURES): VITD: 37.46 ng/mL (ref 30.00–100.00)

## 2023-11-09 LAB — C-REACTIVE PROTEIN: CRP: <1 mg/dL (ref 0.5–20.0)

## 2023-11-09 LAB — URIC ACID: Uric Acid, Serum: 3 mg/dL (ref 2.4–7.0)

## 2023-11-09 LAB — SEDIMENTATION RATE: Sed Rate: 6 mm/h (ref 0–30)

## 2023-11-10 ENCOUNTER — Encounter: Payer: Self-pay | Admitting: Family Medicine

## 2023-11-13 ENCOUNTER — Telehealth: Payer: Self-pay

## 2023-11-13 LAB — CA 125: CA 125: 10 U/mL (ref <35)

## 2023-11-13 LAB — CALCIUM, IONIZED: Calcium, Ion: 5.1 mg/dL (ref 4.7–5.5)

## 2023-11-13 LAB — PTH, INTACT AND CALCIUM
Calcium: 9.5 mg/dL (ref 8.6–10.4)
PTH: 29 pg/mL (ref 16–77)

## 2023-11-13 LAB — CEA: CEA: 3.9 ng/mL — ABNORMAL HIGH

## 2023-11-13 LAB — CYCLIC CITRUL PEPTIDE ANTIBODY, IGG: Cyclic Citrullin Peptide Ab: <16 U

## 2023-11-13 LAB — ANGIOTENSIN CONVERTING ENZYME: Angiotensin-Converting Enzyme: 31 U/L (ref 9–67)

## 2023-11-13 LAB — RHEUMATOID FACTOR: Rheumatoid fact SerPl-aCnc: <10 [IU]/mL (ref <14)

## 2023-11-13 LAB — ANA: Anti Nuclear Antibody (ANA): NEGATIVE

## 2023-11-13 NOTE — Telephone Encounter (Signed)
Copied from CRM [id number]. Topic: Clinical - Lab/Test Results >> Nov 13, 2023 10:18 AM Steele Sizer wrote: Reason for CRM: Pt stated that she would like to request a callback regarding some blood work and her B12 is 100 and would like to know what her PCP would recommend.

## 2023-11-13 NOTE — Telephone Encounter (Signed)
Patient would like for you to look over her labs she had done by [name] downstairs she has concerns about her b12

## 2023-11-13 NOTE — Telephone Encounter (Signed)
Dr. Katrinka Blazing has already sent her a mychart message about her results. Does she have a question about his recommendations?

## 2023-11-14 ENCOUNTER — Telehealth: Payer: Self-pay | Admitting: Family Medicine

## 2023-11-14 ENCOUNTER — Ambulatory Visit (INDEPENDENT_AMBULATORY_CARE_PROVIDER_SITE_OTHER): Payer: Commercial Managed Care - PPO

## 2023-11-14 DIAGNOSIS — E538 Deficiency of other specified B group vitamins: Secondary | ICD-10-CM | POA: Diagnosis not present

## 2023-11-14 MED ORDER — CYANOCOBALAMIN 1000 MCG/ML IJ SOLN
1000.0000 ug | Freq: Once | INTRAMUSCULAR | Status: AC
Start: 2023-11-14 — End: 2023-11-14
  Administered 2023-11-14: 1000 ug via INTRAMUSCULAR

## 2023-11-14 NOTE — Progress Notes (Signed)
Patient received B12 injection in right deltoid per Dr. Katrinka Blazing. Patient tolerated injection well.

## 2023-11-14 NOTE — Telephone Encounter (Signed)
Yes she does

## 2023-11-14 NOTE — Telephone Encounter (Signed)
Spoke w patient and advised that she go into the ED if her pain is unmanageable. Per a verbal from Dr. Katrinka Blazing, [facility] lab is not concerning at this time. Patient voices understanding.

## 2023-11-14 NOTE — Telephone Encounter (Signed)
Patient called asking to talk to someone about her recent lab results. She is very concerned about them. She also said that she is in a lot of pain and is not scheduled for her MRI until next week. She asked if there was a way to have this done sooner or what else [name] would recommend?

## 2023-11-14 NOTE — Telephone Encounter (Signed)
This does not help I do not see information about her question?

## 2023-11-15 NOTE — Telephone Encounter (Signed)
Patient wanted your feedback on to what [name] has recommended far as in how much b12 over the counter should she take and if this is ok

## 2023-11-16 NOTE — Telephone Encounter (Signed)
His recommendation is appropriate

## 2023-11-21 ENCOUNTER — Ambulatory Visit (INDEPENDENT_AMBULATORY_CARE_PROVIDER_SITE_OTHER): Payer: Commercial Managed Care - PPO

## 2023-11-21 DIAGNOSIS — E538 Deficiency of other specified B group vitamins: Secondary | ICD-10-CM

## 2023-11-21 MED ORDER — CYANOCOBALAMIN 1000 MCG/ML IJ SOLN
1000.0000 ug | Freq: Once | INTRAMUSCULAR | Status: AC
Start: 2023-11-21 — End: 2023-11-21
  Administered 2023-11-21: 1000 ug via INTRAMUSCULAR

## 2023-11-21 NOTE — Progress Notes (Signed)
Patient received B12 injection per Dr. Katrinka Blazing. Patient tolerated injection well.

## 2023-11-23 ENCOUNTER — Ambulatory Visit
Admission: RE | Admit: 2023-11-23 | Discharge: 2023-11-23 | Disposition: A | Discharge status: Home / Self Care | Payer: Commercial Managed Care - PPO | Source: Ambulatory Visit | Attending: Family Medicine | Admitting: Family Medicine

## 2023-11-23 DIAGNOSIS — R109 Unspecified abdominal pain: Secondary | ICD-10-CM | POA: Diagnosis not present

## 2023-11-23 DIAGNOSIS — R1084 Generalized abdominal pain: Secondary | ICD-10-CM

## 2023-11-23 DIAGNOSIS — R102 Pelvic and perineal pain: Secondary | ICD-10-CM | POA: Diagnosis not present

## 2023-11-23 MED ORDER — GADOPICLENOL 0.5 MMOL/ML IV SOLN
5.0000 mL | Freq: Once | INTRAVENOUS | Status: AC | PRN
  Administered 2023-11-23: 5 mL via INTRAVENOUS

## 2023-11-26 ENCOUNTER — Encounter: Payer: Self-pay | Admitting: Family Medicine

## 2023-11-28 ENCOUNTER — Ambulatory Visit: Payer: Commercial Managed Care - PPO

## 2023-11-28 DIAGNOSIS — E538 Deficiency of other specified B group vitamins: Secondary | ICD-10-CM

## 2023-11-28 DIAGNOSIS — M2550 Pain in unspecified joint: Secondary | ICD-10-CM

## 2023-11-28 MED ORDER — CYANOCOBALAMIN 1000 MCG/ML IJ SOLN
1000.0000 ug | Freq: Once | INTRAMUSCULAR | Status: AC
  Administered 2023-11-28: 1000 ug via INTRAMUSCULAR

## 2023-11-28 NOTE — Progress Notes (Signed)
Pt received Cyanocobalamin 1000 mcg/mL, 1 mL, IM, right deltoid, pt tolerated well. MFG: [employer], lot#: [id number], exp: 2026/05, ndc: [id number]

## 2023-12-05 ENCOUNTER — Ambulatory Visit: Payer: Commercial Managed Care - PPO

## 2023-12-06 DIAGNOSIS — H5203 Hypermetropia, bilateral: Secondary | ICD-10-CM | POA: Diagnosis not present

## 2023-12-06 DIAGNOSIS — H52203 Unspecified astigmatism, bilateral: Secondary | ICD-10-CM | POA: Diagnosis not present

## 2023-12-08 ENCOUNTER — Ambulatory Visit: Payer: Commercial Managed Care - PPO

## 2023-12-08 DIAGNOSIS — E538 Deficiency of other specified B group vitamins: Secondary | ICD-10-CM | POA: Diagnosis not present

## 2023-12-08 MED ORDER — CYANOCOBALAMIN 1000 MCG/ML IJ SOLN
1000.0000 ug | Freq: Once | INTRAMUSCULAR | Status: AC
  Administered 2023-12-08: 1000 ug via INTRAMUSCULAR

## 2023-12-08 NOTE — Progress Notes (Signed)
 Patient given B12 in left deltoid today. Patient tolerated well.

## 2024-01-05 ENCOUNTER — Ambulatory Visit: Payer: Commercial Managed Care - PPO

## 2024-01-08 ENCOUNTER — Ambulatory Visit

## 2024-02-05 NOTE — Progress Notes (Unsigned)
 [facility] [address] [place] [address] Phone: [phone] Subjective:   Joanne Booth, am serving as a scribe for Dr. Ronnell Coins.  I'm seeing this patient by the request  of:  Adelia Homestead, MD  CC: back pain follow up   BJY:[id number]  11/08/2023 Patient unfortunately continues to have worsening pain at the moment.  Severe enough that it is stopping her from activity.  Patient states they can wake her up at night.  Patient is a been unable to gain weight.  No worsening abdominal pain but unfortunately does not seem like she is having regular bowel movements though either.  Patient has these audible popping in the soft tissue in the paraspinal musculature of the lumbar spine.  Significantly louder than what would be anticipated.  No masses appreciated.  Do feel with patient failing all conservative therapy including multiple different injections including medial branch block, radiofrequency ablation, facet injections and not making any significant improvement do feel that further imaging of the abdomen and pelvis is necessary to further evaluate for any other abnormalities that could be potentially contributing.  Pain is continuing to give significant difficulty and has been going on for multiple years.  Will get laboratory workup as well to make sure nothing else is abnormal at this moment.   Updated 02/07/2024 Joanne Booth is a 58 y.o. female coming in with complaint of back pain. Patient states that her pinching, dull pain is constant and worse near the end of the day. Sitting in recliner alleviates some of her pain. Wonders if she has a tear above PSIS.    Negative pelvis and abdomen MRI     Past Medical History:  Diagnosis Date   Basal cell carcinoma    Past Surgical History:  Procedure Laterality Date   basal cell skin cancer removals     Social History   Socioeconomic History   Marital status: Married    Spouse  name: Not on file   Number of children: 3   Years of education: Not on file   Highest education level: Not on file  Occupational History   Not on file  Tobacco Use   Smoking status: Never   Smokeless tobacco: Never  Vaping Use   Vaping status: Never Used  Substance and Sexual Activity   Alcohol use: Yes    Alcohol/week: 0.0 standard drinks of alcohol    Comment: occasionally    Drug use: Never   Sexual activity: Yes  Other Topics Concern   Not on file  Social History Narrative   Lives home with husband and daughter   Right handed   Caffeine: 1 cup/day   Social Drivers of Corporate investment banker Strain: Not on file  Food Insecurity: Not on file  Transportation Needs: Not on file  Physical Activity: Not on file  Stress: Not on file  Social Connections: Unknown (02/25/2022)   Received from [facility]   Social Network    Social Network: Not on file   No Known Allergies Family History  Problem Relation Age of Onset   Breast cancer Mother    Osteoarthritis Mother    Osteoarthritis Other    Migraines Son        started when he was a pre-teen, aura, migraine, "full blown"       Current Outpatient Medications (Analgesics):    celecoxib  (CELEBREX ) 100 MG capsule, Take 1 capsule (100 mg total) by mouth 2 (  two) times daily.   diclofenac  (VOLTAREN ) 75 MG EC tablet, Take 1 tablet (75 mg total) by mouth 2 (two) times daily.   meloxicam  (MOBIC ) 15 MG tablet, Take 1 tablet (15 mg total) by mouth daily.   Current Outpatient Medications (Other):    VITAMIN D , CHOLECALCIFEROL , PO, Take 5,000 Int'l Units/1.7m2 by mouth.   Reviewed prior external information including notes and imaging from  primary care provider As well as notes that were available from care everywhere and other healthcare systems.  Past medical history, social, surgical and family history all reviewed in electronic medical record.  No pertanent information unless stated regarding to the  chief complaint.   Review of Systems:  No headache, visual changes, nausea, vomiting, diarrhea, constipation, dizziness, abdominal pain, skin rash, fevers, chills, night sweats, weight loss, swollen lymph nodes, body aches, joint swelling, chest pain, shortness of breath, mood changes. POSITIVE muscle aches  Objective  Blood pressure 106/72, pulse 71, height 5\' 5"  (1.651 m), weight 114 lb (51.7 kg), last menstrual period 09/18/2013, SpO2 98%.   General: No apparent distress alert and oriented x3 mood and affect normal, dressed appropriately.  HEENT: Pupils equal, extraocular movements intact  Respiratory: Patient's speak in full sentences and does not appear short of breath  Cardiovascular: No lower extremity edema, non tender, no erythema  Back exam shows severe tenderness over the sacroiliac joint tightness on the left side noted. Tightness noted in the paraspinal musculature on the left side as well.  Worsening pain with extension of the back.  After verbal consent patient was prepped with alcohol swab and with a 21-gauge 2 inch needle injected into the left sacroiliac joint with a total of 1 cc of 0.5% Marcaine and 1 cc of Kenalog 40 mg/mL.  No blood loss.  Band-Aid placed.  Postinjection instructions given.     Impression and Recommendations:    The above documentation has been reviewed and is accurate and complete Shivangi Lutz M Meng Winterton, DO

## 2024-02-07 ENCOUNTER — Encounter: Payer: Self-pay | Admitting: Family Medicine

## 2024-02-07 ENCOUNTER — Other Ambulatory Visit (HOSPITAL_COMMUNITY): Payer: Self-pay

## 2024-02-07 ENCOUNTER — Ambulatory Visit: Payer: Commercial Managed Care - PPO | Admitting: Family Medicine

## 2024-02-07 VITALS — BP 106/72 | HR 71 | Ht 65.0 in | Wt 114.0 lb

## 2024-02-07 DIAGNOSIS — M533 Sacrococcygeal disorders, not elsewhere classified: Secondary | ICD-10-CM

## 2024-02-07 DIAGNOSIS — D519 Vitamin B12 deficiency anemia, unspecified: Secondary | ICD-10-CM | POA: Insufficient documentation

## 2024-02-07 DIAGNOSIS — D510 Vitamin B12 deficiency anemia due to intrinsic factor deficiency: Secondary | ICD-10-CM

## 2024-02-07 DIAGNOSIS — G8929 Other chronic pain: Secondary | ICD-10-CM | POA: Insufficient documentation

## 2024-02-07 DIAGNOSIS — E538 Deficiency of other specified B group vitamins: Secondary | ICD-10-CM | POA: Diagnosis not present

## 2024-02-07 LAB — VITAMIN B12: Vitamin B-12: 706 pg/mL (ref 211–911)

## 2024-02-07 MED ORDER — CELECOXIB 100 MG PO CAPS
100.0000 mg | ORAL_CAPSULE | Freq: Two times a day (BID) | ORAL | 0 refills | Status: DC
  Filled 2024-02-07: qty 60, 30d supply, fill #0

## 2024-02-07 NOTE — Assessment & Plan Note (Signed)
 Patient given injection today.  Hopeful that this will make some improvement.  Unfortunately advanced imaging has not given us  quite a new information that we were hoping for for better treatment.  Discussed icing regimen otherwise, home exercises, which activities to do and which ones to avoid.  Patient does have had facet injections from an outside facility previously with no significant benefit.  Muscle relaxer prescribed today.  Celebrex  100 mg twice a day also prescribed.  Follow-up again 6 to 8 weeks to see how patient responds

## 2024-02-07 NOTE — Patient Instructions (Addendum)
 Good to see you. Labs today on the way out. Start Celebrex  100 mg BID. Update us  next week. Return in 2 months.

## 2024-02-07 NOTE — Assessment & Plan Note (Signed)
 Injection was going to be given but will check value first with patient's recent oral supplementation

## 2024-02-27 DIAGNOSIS — Z1231 Encounter for screening mammogram for malignant neoplasm of breast: Secondary | ICD-10-CM | POA: Diagnosis not present

## 2024-02-27 DIAGNOSIS — Z681 Body mass index (BMI) 19 or less, adult: Secondary | ICD-10-CM | POA: Diagnosis not present

## 2024-02-27 DIAGNOSIS — Z01419 Encounter for gynecological examination (general) (routine) without abnormal findings: Secondary | ICD-10-CM | POA: Diagnosis not present

## 2024-02-27 DIAGNOSIS — Z124 Encounter for screening for malignant neoplasm of cervix: Secondary | ICD-10-CM | POA: Diagnosis not present

## 2024-03-12 ENCOUNTER — Other Ambulatory Visit: Payer: Self-pay

## 2024-03-12 ENCOUNTER — Encounter: Payer: Self-pay | Admitting: Family Medicine

## 2024-03-12 ENCOUNTER — Other Ambulatory Visit (HOSPITAL_COMMUNITY): Payer: Self-pay

## 2024-03-12 DIAGNOSIS — M5416 Radiculopathy, lumbar region: Secondary | ICD-10-CM

## 2024-03-12 MED ORDER — PREDNISONE 20 MG PO TABS
40.0000 mg | ORAL_TABLET | Freq: Every day | ORAL | 0 refills | Status: DC
  Filled 2024-03-12: qty 10, 5d supply, fill #0

## 2024-03-18 ENCOUNTER — Encounter: Payer: Self-pay | Admitting: Family Medicine

## 2024-03-19 NOTE — Discharge Instructions (Signed)

## 2024-03-21 ENCOUNTER — Inpatient Hospital Stay
Admission: RE | Admit: 2024-03-21 | Discharge: 2024-03-21 | Disposition: A | Discharge status: Home / Self Care | Source: Ambulatory Visit | Attending: Family Medicine | Admitting: Family Medicine

## 2024-04-11 ENCOUNTER — Other Ambulatory Visit (HOSPITAL_COMMUNITY): Payer: Self-pay

## 2024-04-11 ENCOUNTER — Other Ambulatory Visit: Payer: Self-pay | Admitting: Family Medicine

## 2024-04-12 ENCOUNTER — Other Ambulatory Visit (HOSPITAL_COMMUNITY): Payer: Self-pay

## 2024-04-12 MED ORDER — CELECOXIB 100 MG PO CAPS
100.0000 mg | ORAL_CAPSULE | Freq: Two times a day (BID) | ORAL | 0 refills | Status: DC
  Filled 2024-04-12: qty 60, 30d supply, fill #0

## 2024-04-18 ENCOUNTER — Ambulatory Visit
Admission: RE | Admit: 2024-04-18 | Discharge: 2024-04-18 | Disposition: A | Discharge status: Home / Self Care | Source: Ambulatory Visit | Attending: Family Medicine | Admitting: Family Medicine

## 2024-04-18 DIAGNOSIS — M5136 Other intervertebral disc degeneration, lumbar region with discogenic back pain only: Secondary | ICD-10-CM | POA: Diagnosis not present

## 2024-04-18 DIAGNOSIS — M5416 Radiculopathy, lumbar region: Secondary | ICD-10-CM

## 2024-04-18 MED ORDER — IOPAMIDOL (ISOVUE-M 200) INJECTION 41%
1.0000 mL | Freq: Once | INTRAMUSCULAR | Status: AC
  Administered 2024-04-18: 1 mL via EPIDURAL

## 2024-04-18 MED ORDER — METHYLPREDNISOLONE ACETATE 40 MG/ML INJ SUSP (RADIOLOG
80.0000 mg | Freq: Once | INTRAMUSCULAR | Status: AC
  Administered 2024-04-18: 80 mg via EPIDURAL

## 2024-04-18 NOTE — Progress Notes (Deleted)
 [facility] [address] [place] [address] Phone: [phone] Subjective:    I'm seeing this patient by the request  of:  Rollene Almarie LABOR, MD  CC:   [name]  02/07/2024 Injection was going to be given but will check value first with patient's recent oral supplementation     Patient given injection today.  Hopeful that this will make some improvement.  Unfortunately advanced imaging has not given us  quite a new information that we were hoping for for better treatment.  Discussed icing regimen otherwise, home exercises, which activities to do and which ones to avoid.  Patient does have had facet injections from an outside facility previously with no significant benefit.  Muscle relaxer prescribed today.  Celebrex  100 mg twice a day also prescribed.  Follow-up again 6 to 8 weeks to see how patient responds     Updated 04/23/2024 Joanne Booth is a 58 y.o. female coming in with complaint of SI joint pain       Past Medical History:  Diagnosis Date   Basal cell carcinoma    Past Surgical History:  Procedure Laterality Date   basal cell skin cancer removals     Social History   Socioeconomic History   Marital status: Married    Spouse name: Not on file   Number of children: 3   Years of education: Not on file   Highest education level: Not on file  Occupational History   Not on file  Tobacco Use   Smoking status: Never   Smokeless tobacco: Never  Vaping Use   Vaping status: Never Used  Substance and Sexual Activity   Alcohol use: Yes    Alcohol/week: 0.0 standard drinks of alcohol    Comment: occasionally    Drug use: Never   Sexual activity: Yes  Other Topics Concern   Not on file  Social History Narrative   Lives home with husband and daughter   Right handed   Caffeine: 1 cup/day   Social Drivers of Corporate investment banker Strain: Not on file  Food Insecurity: Not on file  Transportation Needs: Not  on file  Physical Activity: Not on file  Stress: Not on file  Social Connections: Unknown (02/25/2022)   Received from [facility]   Social Network    Social Network: Not on file   No Known Allergies Family History  Problem Relation Age of Onset   Breast cancer Mother    Osteoarthritis Mother    Osteoarthritis Other    Migraines Son        started when he was a pre-teen, aura, migraine, full blown    Current Outpatient Medications (Endocrine & Metabolic):    predniSONE  (DELTASONE ) 20 MG tablet, Take 2 tablets (40 mg total) by mouth daily with breakfast.    Current Outpatient Medications (Analgesics):    celecoxib  (CELEBREX ) 100 MG capsule, Take 1 capsule (100 mg total) by mouth 2 (two) times daily.   diclofenac  (VOLTAREN ) 75 MG EC tablet, Take 1 tablet (75 mg total) by mouth 2 (two) times daily.   meloxicam  (MOBIC ) 15 MG tablet, Take 1 tablet (15 mg total) by mouth daily.   Current Outpatient Medications (Other):    VITAMIN D , CHOLECALCIFEROL , PO, Take 5,000 Int'l Units/1.7m2 by mouth.   Reviewed prior external information including notes and imaging from  primary care provider As well as notes that were available from care everywhere and other healthcare systems.  Past medical history,  social, surgical and family history all reviewed in electronic medical record.  No pertanent information unless stated regarding to the chief complaint.   Review of Systems:  No headache, visual changes, nausea, vomiting, diarrhea, constipation, dizziness, abdominal pain, skin rash, fevers, chills, night sweats, weight loss, swollen lymph nodes, body aches, joint swelling, chest pain, shortness of breath, mood changes. POSITIVE muscle aches  Objective  Last menstrual period 09/18/2013.   General: No apparent distress alert and oriented x3 mood and affect normal, dressed appropriately.  HEENT: Pupils equal, extraocular movements intact  Respiratory: Patient's speak in full sentences  and does not appear short of breath  Cardiovascular: No lower extremity edema, non tender, no erythema      Impression and Recommendations:

## 2024-04-18 NOTE — Discharge Instructions (Signed)

## 2024-04-23 ENCOUNTER — Ambulatory Visit: Admitting: Family Medicine

## 2024-06-11 ENCOUNTER — Other Ambulatory Visit (HOSPITAL_COMMUNITY): Payer: Self-pay

## 2024-06-11 ENCOUNTER — Other Ambulatory Visit: Payer: Self-pay | Admitting: Family Medicine

## 2024-06-11 MED ORDER — CELECOXIB 100 MG PO CAPS
100.0000 mg | ORAL_CAPSULE | Freq: Two times a day (BID) | ORAL | 0 refills | Status: AC
  Filled 2024-06-11: qty 60, 30d supply, fill #0

## 2024-06-12 ENCOUNTER — Other Ambulatory Visit: Payer: Self-pay

## 2024-06-12 ENCOUNTER — Encounter: Payer: Self-pay | Admitting: Family Medicine

## 2024-06-12 DIAGNOSIS — M5416 Radiculopathy, lumbar region: Secondary | ICD-10-CM

## 2024-06-12 NOTE — Progress Notes (Deleted)
 [facility] [address] [place] [address] Phone: [phone] Subjective:    I'm seeing this patient by the request  of:  Rollene Almarie LABOR, MD  CC:   [name]  Joanne Booth is a 58 y.o. female coming in with complaint of L SI joint pain. Epidural 04/18/2024. Patient states       Past Medical History:  Diagnosis Date   Basal cell carcinoma    Past Surgical History:  Procedure Laterality Date   basal cell skin cancer removals     Social History   Socioeconomic History   Marital status: Married    Spouse name: Not on file   Number of children: 3   Years of education: Not on file   Highest education level: Not on file  Occupational History   Not on file  Tobacco Use   Smoking status: Never   Smokeless tobacco: Never  Vaping Use   Vaping status: Never Used  Substance and Sexual Activity   Alcohol use: Yes    Alcohol/week: 0.0 standard drinks of alcohol    Comment: occasionally    Drug use: Never   Sexual activity: Yes  Other Topics Concern   Not on file  Social History Narrative   Lives home with husband and daughter   Right handed   Caffeine: 1 cup/day   Social Drivers of Corporate investment banker Strain: Not on file  Food Insecurity: Not on file  Transportation Needs: Not on file  Physical Activity: Not on file  Stress: Not on file  Social Connections: Unknown (02/25/2022)   Received from [employer]   Social Network    Social Network: Not on file   No Known Allergies Family History  Problem Relation Age of Onset   Breast cancer Mother    Osteoarthritis Mother    Osteoarthritis Other    Migraines Son        started when he was a pre-teen, aura, migraine, full blown    Current Outpatient Medications (Endocrine & Metabolic):    predniSONE  (DELTASONE ) 20 MG tablet, Take 2 tablets (40 mg total) by mouth daily with breakfast.    Current Outpatient Medications (Analgesics):    celecoxib   (CELEBREX ) 100 MG capsule, Take 1 capsule (100 mg total) by mouth 2 (two) times daily.   diclofenac  (VOLTAREN ) 75 MG EC tablet, Take 1 tablet (75 mg total) by mouth 2 (two) times daily.   meloxicam  (MOBIC ) 15 MG tablet, Take 1 tablet (15 mg total) by mouth daily.   Current Outpatient Medications (Other):    VITAMIN D , CHOLECALCIFEROL , PO, Take 5,000 Int'l Units/1.7m2 by mouth.   Reviewed prior external information including notes and imaging from  primary care provider As well as notes that were available from care everywhere and other healthcare systems.  Past medical history, social, surgical and family history all reviewed in electronic medical record.  No pertanent information unless stated regarding to the chief complaint.   Review of Systems:  No headache, visual changes, nausea, vomiting, diarrhea, constipation, dizziness, abdominal pain, skin rash, fevers, chills, night sweats, weight loss, swollen lymph nodes, body aches, joint swelling, chest pain, shortness of breath, mood changes. POSITIVE muscle aches  Objective  Last menstrual period 09/18/2013.   General: No apparent distress alert and oriented x3 mood and affect normal, dressed appropriately.  HEENT: Pupils equal, extraocular movements intact  Respiratory: Patient's speak in full sentences and does not appear short of breath  Cardiovascular: No lower  extremity edema, non tender, no erythema      Impression and Recommendations:

## 2024-06-13 ENCOUNTER — Ambulatory Visit: Admitting: Family Medicine

## 2024-06-25 ENCOUNTER — Other Ambulatory Visit (HOSPITAL_COMMUNITY): Payer: Self-pay

## 2024-06-25 DIAGNOSIS — H00011 Hordeolum externum right upper eyelid: Secondary | ICD-10-CM | POA: Diagnosis not present

## 2024-06-25 MED ORDER — AZITHROMYCIN 250 MG PO TABS
ORAL_TABLET | ORAL | 0 refills | Status: AC
  Filled 2024-06-25: qty 6, 5d supply, fill #0

## 2024-06-25 NOTE — Discharge Instructions (Signed)

## 2024-06-26 ENCOUNTER — Inpatient Hospital Stay
Admission: RE | Admit: 2024-06-26 | Discharge: 2024-06-26 | Disposition: A | Discharge status: Home / Self Care | Source: Ambulatory Visit | Attending: Family Medicine | Admitting: Family Medicine

## 2024-07-22 NOTE — Discharge Instructions (Signed)

## 2024-07-24 ENCOUNTER — Inpatient Hospital Stay
Admission: RE | Admit: 2024-07-24 | Discharge: 2024-07-24 | Disposition: A | Source: Ambulatory Visit | Attending: Family Medicine | Admitting: Family Medicine

## 2024-07-24 DIAGNOSIS — M4726 Other spondylosis with radiculopathy, lumbar region: Secondary | ICD-10-CM | POA: Diagnosis not present

## 2024-07-24 DIAGNOSIS — M5416 Radiculopathy, lumbar region: Secondary | ICD-10-CM

## 2024-07-24 MED ORDER — METHYLPREDNISOLONE ACETATE 40 MG/ML INJ SUSP (RADIOLOG
80.0000 mg | Freq: Once | INTRAMUSCULAR | Status: AC
  Administered 2024-07-24: 80 mg via EPIDURAL

## 2024-07-24 MED ORDER — IOPAMIDOL (ISOVUE-M 200) INJECTION 41%
1.0000 mL | Freq: Once | INTRAMUSCULAR | Status: AC
  Administered 2024-07-24: 1 mL via EPIDURAL

## 2024-08-06 NOTE — Progress Notes (Unsigned)
 [facility] [address] [place] [address] Phone: [phone] Subjective:   LILLETTE Berwyn Posey, am serving as a scribe for Dr. Arthea Claudene.  I'm seeing this patient by the request  of:  Rollene Almarie LABOR, MD  CC: Low back pain  [name]  02/07/2024 Patient given injection today.  Hopeful that this will make some improvement.  Unfortunately advanced imaging has not given us  quite a new information that we were hoping for for better treatment.  Discussed icing regimen otherwise, home exercises, which activities to do and which ones to avoid.  Patient does have had facet injections from an outside facility previously with no significant benefit.  Muscle relaxer prescribed today.  Celebrex  100 mg twice a day also prescribed.  Follow-up again 6 to 8 weeks to see how patient responds     Injection was going to be given but will check value first with patient's recent oral supplementation   Update 08/07/2024 JOYCIE AERTS is a 58 y.o. female coming in with complaint of SI jt pain. Epidural 07/24/2024. Patient states that epidural in July lasted 2-3 weeks. Epidural two weeks ago was not helpful. Constant pinching in L SI joint. Pain can wrap around to front of body at its worst. Near the end of the day her back locks up due to pain.      Past Medical History:  Diagnosis Date   Basal cell carcinoma    Past Surgical History:  Procedure Laterality Date   basal cell skin cancer removals     Social History   Socioeconomic History   Marital status: Married    Spouse name: Not on file   Number of children: 3   Years of education: Not on file   Highest education level: Not on file  Occupational History   Not on file  Tobacco Use   Smoking status: Never   Smokeless tobacco: Never  Vaping Use   Vaping status: Never Used  Substance and Sexual Activity   Alcohol use: Yes    Alcohol/week: 0.0 standard drinks of alcohol    Comment:  occasionally    Drug use: Never   Sexual activity: Yes  Other Topics Concern   Not on file  Social History Narrative   Lives home with husband and daughter   Right handed   Caffeine: 1 cup/day   Social Drivers of Corporate investment banker Strain: Not on file  Food Insecurity: Not on file  Transportation Needs: Not on file  Physical Activity: Not on file  Stress: Not on file  Social Connections: Unknown (02/25/2022)   Received from [facility]   Social Network    Social Network: Not on file   No Known Allergies Family History  Problem Relation Age of Onset   Breast cancer Mother    Osteoarthritis Mother    Osteoarthritis Other    Migraines Son        started when he was a pre-teen, aura, migraine, full blown    Current Outpatient Medications (Endocrine & Metabolic):    predniSONE  (DELTASONE ) 20 MG tablet, Take 2 tablets (40 mg total) by mouth daily with breakfast.    Current Outpatient Medications (Analgesics):    celecoxib  (CELEBREX ) 100 MG capsule, Take 1 capsule (100 mg total) by mouth 2 (two) times daily.   diclofenac  (VOLTAREN ) 75 MG EC tablet, Take 1 tablet (75 mg total) by mouth 2 (two) times daily.   meloxicam  (MOBIC ) 15 MG tablet, Take 1  tablet (15 mg total) by mouth daily.   Current Outpatient Medications (Other):    VITAMIN D , CHOLECALCIFEROL , PO, Take 5,000 Int'l Units/1.7m2 by mouth.   Reviewed prior external information including notes and imaging from  primary care provider As well as notes that were available from care everywhere and other healthcare systems.  Past medical history, social, surgical and family history all reviewed in electronic medical record.  No pertanent information unless stated regarding to the chief complaint.   Review of Systems:  No headache, visual changes, nausea, vomiting, diarrhea, constipation, dizziness, abdominal pain, skin rash, fevers, chills, night sweats, weight loss, swollen lymph nodes, body aches, joint  swelling, chest pain, shortness of breath, mood changes. POSITIVE muscle aches  Objective  Blood pressure 98/70, pulse 64, height 5' 5 (1.651 m), weight 113 lb (51.3 kg), last menstrual period 09/18/2013, SpO2 98%.   General: No apparent distress alert and oriented x3 mood and affect normal, dressed appropriately.  HEENT: Pupils equal, extraocular movements intact  Respiratory: Patient's speak in full sentences and does not appear short of breath  Cardiovascular: No lower extremity edema, non tender, no erythema  Low back pain seems to be more constant over the sacroiliac joint, fairly significant to palpation in this area.  Mild positive Deri but negative straight leg test noted today.  Neurovascularly intact distally.   After verbal consent patient was prepped with alcohol swab and with a 21-gauge tenderness needle injected into the left sacroiliac joint with a 21 cc of 0.5% Marcaine and 1 cc of Kenalog 40 mg/mL.  No blood loss.  Band-Aid placed.  Postinjection instruction given   Impression and Recommendations:

## 2024-08-07 ENCOUNTER — Ambulatory Visit: Payer: Self-pay | Admitting: Family Medicine

## 2024-08-07 ENCOUNTER — Encounter: Payer: Self-pay | Admitting: Family Medicine

## 2024-08-07 ENCOUNTER — Ambulatory Visit: Admitting: Family Medicine

## 2024-08-07 VITALS — BP 98/70 | HR 64 | Ht 65.0 in | Wt 113.0 lb

## 2024-08-07 DIAGNOSIS — D2272 Melanocytic nevi of left lower limb, including hip: Secondary | ICD-10-CM | POA: Diagnosis not present

## 2024-08-07 DIAGNOSIS — Z85828 Personal history of other malignant neoplasm of skin: Secondary | ICD-10-CM | POA: Diagnosis not present

## 2024-08-07 DIAGNOSIS — G8929 Other chronic pain: Secondary | ICD-10-CM | POA: Diagnosis not present

## 2024-08-07 DIAGNOSIS — R5383 Other fatigue: Secondary | ICD-10-CM | POA: Diagnosis not present

## 2024-08-07 DIAGNOSIS — D2271 Melanocytic nevi of right lower limb, including hip: Secondary | ICD-10-CM | POA: Diagnosis not present

## 2024-08-07 DIAGNOSIS — D2262 Melanocytic nevi of left upper limb, including shoulder: Secondary | ICD-10-CM | POA: Diagnosis not present

## 2024-08-07 DIAGNOSIS — M533 Sacrococcygeal disorders, not elsewhere classified: Secondary | ICD-10-CM | POA: Diagnosis not present

## 2024-08-07 DIAGNOSIS — D485 Neoplasm of uncertain behavior of skin: Secondary | ICD-10-CM | POA: Diagnosis not present

## 2024-08-07 DIAGNOSIS — D1801 Hemangioma of skin and subcutaneous tissue: Secondary | ICD-10-CM | POA: Diagnosis not present

## 2024-08-07 DIAGNOSIS — L570 Actinic keratosis: Secondary | ICD-10-CM | POA: Diagnosis not present

## 2024-08-07 DIAGNOSIS — L821 Other seborrheic keratosis: Secondary | ICD-10-CM | POA: Diagnosis not present

## 2024-08-07 DIAGNOSIS — L812 Freckles: Secondary | ICD-10-CM | POA: Diagnosis not present

## 2024-08-07 LAB — VITAMIN B12: Vitamin B-12: 769 pg/mL (ref 211–911)

## 2024-08-07 NOTE — Patient Instructions (Addendum)
 Labs today Injected L SI joint Read about Cymbalta See me again in  8-10 weeks

## 2024-08-07 NOTE — Assessment & Plan Note (Signed)
 This could make some difference.  Responding well to the epidurals. Unfortunately it does not make improvement further down to the 5-day medications.  Would highly recommended Cymbalta as a potential treatment option if patient continues to have this discomfort.  We also discussed the potential for the colonoscopy.  Recheck B12 and CEA levels.  Follow-up with me again in 2 to 3 months.

## 2024-08-08 LAB — CEA: CEA: 3.7 ng/mL — ABNORMAL HIGH

## 2024-09-17 ENCOUNTER — Other Ambulatory Visit (HOSPITAL_COMMUNITY): Payer: Self-pay

## 2024-09-17 DIAGNOSIS — C44719 Basal cell carcinoma of skin of left lower limb, including hip: Secondary | ICD-10-CM | POA: Diagnosis not present

## 2024-09-17 DIAGNOSIS — D485 Neoplasm of uncertain behavior of skin: Secondary | ICD-10-CM | POA: Diagnosis not present

## 2024-09-17 DIAGNOSIS — Z85828 Personal history of other malignant neoplasm of skin: Secondary | ICD-10-CM | POA: Diagnosis not present

## 2024-09-17 DIAGNOSIS — C44519 Basal cell carcinoma of skin of other part of trunk: Secondary | ICD-10-CM | POA: Diagnosis not present

## 2024-09-17 DIAGNOSIS — C44712 Basal cell carcinoma of skin of right lower limb, including hip: Secondary | ICD-10-CM | POA: Diagnosis not present

## 2024-09-17 DIAGNOSIS — L570 Actinic keratosis: Secondary | ICD-10-CM | POA: Diagnosis not present

## 2024-09-17 MED ORDER — FLUOROURACIL 5 % EX CREA
1.0000 | TOPICAL_CREAM | Freq: Every evening | CUTANEOUS | 0 refills | Status: AC
  Filled 2024-09-17: qty 40, 30d supply, fill #0

## 2024-10-23 ENCOUNTER — Encounter: Payer: Self-pay | Admitting: Internal Medicine

## 2024-10-23 ENCOUNTER — Telehealth: Payer: Self-pay

## 2024-10-23 NOTE — Telephone Encounter (Signed)
 Copied from CRM #[id number]. Topic: Referral - Question >> Oct 23, 2024  2:19 PM Pinkey ORN wrote: Reason for CRM: Referral >> Oct 23, 2024  2:22 PM Pinkey ORN wrote: Patient is wanting to know if she could have a referral sent over to an ENT, in regards to right ear pain. Patient states it feels as though there's something in there  Dr. Karis [phone] [facility]  [address]. [address]

## 2024-10-24 NOTE — Telephone Encounter (Signed)
 Pt has appt scheduled for 10/29/24@340pm .

## 2024-10-28 NOTE — Telephone Encounter (Signed)
 Would recommend acute visit here first

## 2024-10-28 NOTE — Telephone Encounter (Signed)
Appt scheduled 11/3

## 2024-10-29 ENCOUNTER — Ambulatory Visit: Admitting: Internal Medicine

## 2024-10-29 ENCOUNTER — Encounter: Payer: Self-pay | Admitting: Internal Medicine

## 2024-10-29 VITALS — BP 114/74 | HR 62 | Temp 98.6°F | Ht 65.0 in | Wt 116.2 lb

## 2024-10-29 DIAGNOSIS — Z85828 Personal history of other malignant neoplasm of skin: Secondary | ICD-10-CM

## 2024-10-29 DIAGNOSIS — D510 Vitamin B12 deficiency anemia due to intrinsic factor deficiency: Secondary | ICD-10-CM | POA: Diagnosis not present

## 2024-10-29 DIAGNOSIS — H9201 Otalgia, right ear: Secondary | ICD-10-CM

## 2024-10-29 DIAGNOSIS — Z0000 Encounter for general adult medical examination without abnormal findings: Secondary | ICD-10-CM

## 2024-10-29 DIAGNOSIS — Z0001 Encounter for general adult medical examination with abnormal findings: Secondary | ICD-10-CM

## 2024-10-29 LAB — CBC
HCT: 40.2 % (ref 36.0–46.0)
Hemoglobin: 13.7 g/dL (ref 12.0–15.0)
MCHC: 34.1 g/dL (ref 30.0–36.0)
MCV: 92.8 fl (ref 78.0–100.0)
Platelets: 278 K/uL (ref 150.0–400.0)
RBC: 4.33 Mil/uL (ref 3.87–5.11)
RDW: 13.3 % (ref 11.5–15.5)
WBC: 7.6 K/uL (ref 4.0–10.5)

## 2024-10-29 LAB — COMPREHENSIVE METABOLIC PANEL WITH GFR
ALT: 23 U/L (ref 3–35)
AST: 23 U/L (ref 5–37)
Albumin: 4.5 g/dL (ref 3.5–5.2)
Alkaline Phosphatase: 51 U/L (ref 39–117)
BUN: 13 mg/dL (ref 6–23)
CO2: 29 meq/L (ref 19–32)
Calcium: 9.7 mg/dL (ref 8.4–10.5)
Chloride: 100 meq/L (ref 96–112)
Creatinine, Ser: 0.9 mg/dL (ref 0.40–1.20)
GFR: 69.27 mL/min
Glucose, Bld: 92 mg/dL (ref 70–99)
Potassium: 4.4 meq/L (ref 3.5–5.1)
Sodium: 136 meq/L (ref 135–145)
Total Bilirubin: 0.5 mg/dL (ref 0.2–1.2)
Total Protein: 7.3 g/dL (ref 6.0–8.3)

## 2024-10-29 LAB — LIPID PANEL
Cholesterol: 288 mg/dL — ABNORMAL HIGH (ref 28–200)
HDL: 92.8 mg/dL
LDL Cholesterol: 175 mg/dL — ABNORMAL HIGH (ref 10–99)
NonHDL: 195.68
Total CHOL/HDL Ratio: 3
Triglycerides: 101 mg/dL (ref 10.0–149.0)
VLDL: 20.2 mg/dL (ref 0.0–40.0)

## 2024-10-29 NOTE — Progress Notes (Signed)
" ° °  Subjective:   Patient ID: Joanne Booth, female    DOB: [birth year], 59 y.o.   MRN: [id number]  The patient is here for physical. Pertinent topics discussed: Discussed the use of AI scribe software for clinical note transcription with the patient, who gave verbal consent to proceed.  History of Present Illness Joanne Booth is a 59 year old female who presents with ear discomfort and hearing difficulties.  She experiences sharp pains in her right ear, particularly at night, accompanied by a sensation of popping. She suspects wax buildup, as she feels something when in the shower. There have been no significant changes in her hearing recently, but she acknowledges a gradual hearing loss over time, which she attributes to her family history of hearing loss. Her mother is deaf, and her sister has similar issues.  She has a history of osteoarthritis and significant back pain, which she manages with anti-inflammatories and regular exercise, including attending [employer] classes twice a week. She is concerned about her bone density due to a family history of osteoporosis and her own osteopenia status. She engages in strength training to mitigate the progression of her condition.  She has a history of basal cell carcinoma and is monitored regularly by her dermatologist, with recent removals of basal cell lesions. She is on a six-month follow-up schedule.  She takes vitamin D3 and B12 supplements due to previously low levels, with B12 being particularly low at 105. She does not consume fish or seafood but eats chicken and pork. She struggles with taking calcium supplements due to their size but tries to maintain adequate dietary intake.  PMH, [facility], social history reviewed and updated  Review of Systems  Constitutional: Negative.   HENT:  Positive for ear pain.   Eyes: Negative.   Respiratory:  Negative for cough, chest tightness and shortness of breath.   Cardiovascular:  Negative for  chest pain, palpitations and leg swelling.  Gastrointestinal:  Negative for abdominal distention, abdominal pain, constipation, diarrhea, nausea and vomiting.  Musculoskeletal:  Positive for arthralgias.  Skin: Negative.   Neurological: Negative.   Psychiatric/Behavioral: Negative.      Objective:  Physical Exam Constitutional:      Appearance: She is well-developed.  HENT:     Head: Normocephalic and atraumatic.     Right Ear: Tympanic membrane normal.     Left Ear: Tympanic membrane normal.  Cardiovascular:     Rate and Rhythm: Normal rate and regular rhythm.  Pulmonary:     Effort: Pulmonary effort is normal. No respiratory distress.     Breath sounds: Normal breath sounds. No wheezing or rales.  Abdominal:     General: Bowel sounds are normal. There is no distension.     Palpations: Abdomen is soft.     Tenderness: There is no abdominal tenderness.  Musculoskeletal:     Cervical back: Normal range of motion.  Skin:    General: Skin is warm and dry.  Neurological:     Mental Status: She is alert and oriented to person, place, and time.     Coordination: Coordination normal.     Vitals:   10/29/24 1541  BP: 114/74  Pulse: 62  Temp: 98.6 F (37 C)  SpO2: 98%  Weight: 116 lb 3.2 oz (52.7 kg)  Height: 5' 5 (1.651 m)    Assessment & Plan:   "

## 2024-10-29 NOTE — Patient Instructions (Signed)
 We will get you in with Dr. Karis for the ears.

## 2024-10-30 ENCOUNTER — Ambulatory Visit: Payer: Self-pay | Admitting: Internal Medicine

## 2024-11-01 ENCOUNTER — Other Ambulatory Visit (HOSPITAL_COMMUNITY): Payer: Self-pay

## 2024-11-01 DIAGNOSIS — H9201 Otalgia, right ear: Secondary | ICD-10-CM | POA: Insufficient documentation

## 2024-11-01 MED ORDER — PRAVASTATIN SODIUM 20 MG PO TABS
20.0000 mg | ORAL_TABLET | Freq: Every day | ORAL | 3 refills | Status: AC
  Filled 2024-11-01: qty 90, 90d supply, fill #0

## 2024-11-01 NOTE — Assessment & Plan Note (Signed)
 Continue oral lifelong.

## 2024-11-01 NOTE — Assessment & Plan Note (Signed)
 Continue to see derm regularly.

## 2024-11-01 NOTE — Assessment & Plan Note (Signed)
 Flu shot up to date. Pneumonia counseled. Shingrix counseled. Tetanus counseled. Colonoscopy up to date. Mammogram up to date, pap smear up to date. Counseled about sun safety and mole surveillance. Counseled about the dangers of distracted driving. Given 10 year screening recommendations.

## 2024-11-01 NOTE — Assessment & Plan Note (Signed)
 Referral to ENT for assessment of sinus drainage with some ear pain intermittent and may need hearing test as well.

## 2024-11-13 ENCOUNTER — Encounter (INDEPENDENT_AMBULATORY_CARE_PROVIDER_SITE_OTHER): Payer: Self-pay | Admitting: Otolaryngology

## 2024-11-13 ENCOUNTER — Ambulatory Visit (INDEPENDENT_AMBULATORY_CARE_PROVIDER_SITE_OTHER): Admitting: Otolaryngology

## 2024-11-13 VITALS — BP 115/70 | HR 70 | Ht 64.0 in | Wt 115.0 lb

## 2024-11-13 DIAGNOSIS — H6981 Other specified disorders of Eustachian tube, right ear: Secondary | ICD-10-CM | POA: Insufficient documentation

## 2024-11-13 DIAGNOSIS — J309 Allergic rhinitis, unspecified: Secondary | ICD-10-CM | POA: Diagnosis not present

## 2024-11-13 DIAGNOSIS — H6121 Impacted cerumen, right ear: Secondary | ICD-10-CM | POA: Insufficient documentation

## 2024-11-13 DIAGNOSIS — H9041 Sensorineural hearing loss, unilateral, right ear, with unrestricted hearing on the contralateral side: Secondary | ICD-10-CM | POA: Insufficient documentation

## 2024-11-13 DIAGNOSIS — H9201 Otalgia, right ear: Secondary | ICD-10-CM

## 2024-11-13 NOTE — Progress Notes (Signed)
 CC: Right ear discomfort, hearing loss  History of Present Illness Joanne Booth is a 59 year old female who presents with persistent right aural fullness and suspected hearing loss.  She reports a persistent sensation of fullness and the feeling of a foreign body in the right ear, most noticeable during activities such as showering. Symptoms intensified around the holidays, described as a clogged sensation similar to water retention after swimming. No symptoms are present in the left ear.  She experiences intermittent right otalgia, particularly at night when lying down, and occasionally perceives a pulsatile sensation in the right ear synchronous with her heartbeat. She suspects diminished hearing on the right, especially when using earbuds or during air travel, and is concerned about possible asymmetric hearing loss. She denies prior otologic surgery, known otitis media, or recent audiometric evaluation. She denies tinnitus or buzzing, but endorses occasional sharp right otalgia at night.  She has not used otic medications. She is able to perform Valsalva maneuver, though pressure equalization is slower on the right, and she experiences mild right jaw discomfort without pain on mastication. She has a history of seasonal allergic rhinitis in the spring and fall. She denies recent upper respiratory infection, influenza, or COVID-19.     Past Medical History:  Diagnosis Date   Basal cell carcinoma     Past Surgical History:  Procedure Laterality Date   basal cell skin cancer removals      Family History  Problem Relation Age of Onset   Breast cancer Mother    Osteoarthritis Mother    Osteoarthritis Other    Migraines Son        started when he was a pre-teen, aura, migraine, full blown    Social History:  reports that she has never smoked. She has never used smokeless tobacco. She reports current alcohol use. She reports that she does not use drugs.  Allergies:  Allergies[1]  Prior to Admission medications  Medication Sig Start Date End Date Taking? Authorizing Provider  celecoxib  (CELEBREX ) 100 MG capsule Take 1 capsule (100 mg total) by mouth 2 (two) times daily. 06/11/24   Claudene Arthea HERO, DO  Cyanocobalamin  (VITAMIN B-12) 5000 MCG LOZG     [provider]  fluorouracil  (EFUDEX ) 5 % cream Apply a small amount topically to affected area nightly for 7 days. 09/17/24     pravastatin  (PRAVACHOL ) 20 MG tablet Take 1 tablet (20 mg total) by mouth daily. 11/01/24   Rollene Almarie LABOR, MD  VITAMIN D , CHOLECALCIFEROL , PO Take 5,000 Int'l Units/1.7m2 by mouth.    [provider]    Blood pressure 115/70, pulse 70, height 5' 4 (1.626 m), weight 115 lb (52.2 kg), last menstrual period 09/18/2013, SpO2 98%. Exam: General: Communicates without difficulty, well nourished, no acute distress. Head: Normocephalic, no evidence injury, no tenderness, facial buttresses intact without stepoff. Face/sinus: No tenderness to palpation and percussion. Facial movement is normal and symmetric. Eyes: PERRL, EOMI. No scleral icterus, conjunctivae clear. Neuro: CN II exam reveals vision grossly intact.  No nystagmus at any point of gaze. Ears: Auricles well formed without lesions.  Right ear cerumen impaction.  Nose: External evaluation reveals normal support and skin without lesions.  Dorsum is intact.  Anterior rhinoscopy reveals congested mucosa over anterior aspect of inferior turbinates and intact septum.  No purulence noted. Oral:  Oral cavity and oropharynx are intact, symmetric, without erythema or edema.  Mucosa is moist without lesions. Neck: Full range of motion without pain.  There is  no significant lymphadenopathy.  No masses palpable.  Thyroid  bed within normal limits to palpation.  Parotid glands and submandibular glands equal bilaterally without mass.  Trachea is midline. Neuro:  CN 2-12 grossly intact.   Procedure: Right ear cerumen  disimpaction Anesthesia: None Description: Under the operating microscope, the cerumen is carefully removed with a combination of cerumen currette, alligator forceps, and suction catheters.  After the cerumen is removed, the TMs are noted to be normal.  No mass, erythema, or lesions. The patient tolerated the procedure well.    Assessment & Plan Right ear cerumen impaction Post-removal, the ear canal and tympanic membrane appeared normal without evidence of infection or effusion. - Otomicroscopy with right ear cerumen disimpaction. - No acute infection is noted today.  Right ear eustachian tube dysfunction and referred right otalgia Mild right eustachian tube congestion suspected based on delayed pressure equalization and intermittent nocturnal otalgia. No evidence of temporomandibular joint disorder. Symptoms may be related to underlying allergic triggers. - Recommended daily intranasal corticosteroid spray (Flonase or Nasonex) to reduce congestion and support eustachian tube function. - Advised monitoring for persistent or worsening symptoms, including pulsatile tinnitus; recommended further imaging (MRI or CT) if pulsatile tinnitus symptoms become constant or severe.  Allergic rhinitis Allergic rhinitis may contribute to eustachian tube congestion and otologic symptoms. Ongoing management is recommended to prevent exacerbation of ear-related issues. - Recommended daily over-the-counter intranasal corticosteroid spray (Flonase or Nasonex) for allergy management.  Possible right ear hearing loss - Outpatient audiology evaluation to document her hearing level.       Bracken Moffa W Felicia Both 11/13/2024, 2:17 PM      [1] No Known Allergies

## 2024-11-27 ENCOUNTER — Encounter: Admitting: Dietician

## 2024-12-03 ENCOUNTER — Institutional Professional Consult (permissible substitution) (INDEPENDENT_AMBULATORY_CARE_PROVIDER_SITE_OTHER): Admitting: Otolaryngology

## 2024-12-17 ENCOUNTER — Ambulatory Visit (INDEPENDENT_AMBULATORY_CARE_PROVIDER_SITE_OTHER): Admitting: Audiology
